# Patient Record
Sex: Female | Born: 1960
Health system: Southern US, Community
[De-identification: ages and names within clinical notes are randomized; demographics above are authoritative.]

## PROBLEM LIST (undated history)

## (undated) HISTORY — PX: TUBAL LIGATION: SHX77

## (undated) HISTORY — PX: CHOLECYSTECTOMY: SHX55

---

## 2018-05-09 ENCOUNTER — Other Ambulatory Visit: Payer: Self-pay | Admitting: Internal Medicine

## 2018-05-09 DIAGNOSIS — Z1231 Encounter for screening mammogram for malignant neoplasm of breast: Secondary | ICD-10-CM

## 2018-06-12 ENCOUNTER — Ambulatory Visit
Admission: RE | Admit: 2018-06-12 | Discharge: 2018-06-12 | Disposition: A | Discharge status: Home / Self Care | Payer: 59 | Source: Ambulatory Visit | Attending: Internal Medicine | Admitting: Internal Medicine

## 2018-06-12 DIAGNOSIS — Z1231 Encounter for screening mammogram for malignant neoplasm of breast: Secondary | ICD-10-CM

## 2018-09-18 ENCOUNTER — Ambulatory Visit (INDEPENDENT_AMBULATORY_CARE_PROVIDER_SITE_OTHER): Payer: 59

## 2018-09-18 ENCOUNTER — Ambulatory Visit: Payer: 59

## 2018-09-18 ENCOUNTER — Ambulatory Visit: Payer: 59 | Admitting: Podiatry

## 2018-09-18 ENCOUNTER — Encounter: Payer: Self-pay | Admitting: Podiatry

## 2018-09-18 VITALS — BP 101/62 | HR 63 | Resp 16

## 2018-09-18 DIAGNOSIS — M779 Enthesopathy, unspecified: Secondary | ICD-10-CM | POA: Diagnosis not present

## 2018-09-18 DIAGNOSIS — M7661 Achilles tendinitis, right leg: Secondary | ICD-10-CM | POA: Diagnosis not present

## 2018-09-18 MED ORDER — TRIAMCINOLONE ACETONIDE 10 MG/ML IJ SUSP
10.0000 mg | Freq: Once | INTRAMUSCULAR | Status: AC
  Administered 2018-09-18: 10 mg

## 2018-09-18 NOTE — Progress Notes (Signed)
   Subjective:    Patient ID: Kayla Atkinson, female    DOB: [birth year], 59 y.o.   MRN: [id number]  HPI    Review of Systems  All other systems reviewed and are negative.      Objective:   Physical Exam        Assessment & Plan:

## 2018-09-18 NOTE — Progress Notes (Signed)
Subjective:   Patient ID: Kayla Atkinson, female   DOB: 58 y.o.   MRN: [id number]   HPI Patient states have had a lot of pain in the back of the right heel and states that it is been very painful.  Patient states icing has not helped and stretching does help some.  States is been present 6 to 8 months and patient has just stop smoking likes to be active and is having trouble wearing shoe gear due to the discomfort with moderate obesity is complicating factor   Review of Systems  All other systems reviewed and are negative.       Objective:  Physical Exam Vitals signs and nursing note reviewed.  Constitutional:      Appearance: She is well-developed.  Pulmonary:     Effort: Pulmonary effort is normal.  Musculoskeletal: Normal range of motion.  Skin:    General: Skin is warm.  Neurological:     Mental Status: She is alert.     Neurovascular status intact muscle strength was adequate range of motion within normal limits with exquisite discomfort posterior aspect right heel at the insertion of the Achilles lateral side of the calcaneus with enlargement of this area with no pain in the medial or central portion of the tendon.  Has good digital perfusion well oriented x3     Assessment:  Achilles tendinitis right with inflammation fluid buildup with spur formation present     Plan:  H&P condition reviewed and recommended careful injection first explaining chances for rupture with patient.  She is willing to accept risk and today I did do a sterile prep of the lateral side and I carefully injected 3 mg Dexasone Kenalog 5 mg Xylocaine and I then applied air fracture walker to completely immobilize along with instructions for ice and reduced activity.  Reappoint to recheck in 4 weeks or earlier if needed  X-ray indicates spur formation posterior aspect heel is quite large in its nature and is probably part of the pathology she is experiencing

## 2018-09-18 NOTE — Patient Instructions (Signed)

## 2018-10-16 ENCOUNTER — Ambulatory Visit: Payer: 59 | Admitting: Podiatry

## 2018-10-21 ENCOUNTER — Ambulatory Visit: Payer: 59 | Admitting: Podiatry

## 2019-01-27 DIAGNOSIS — M5410 Radiculopathy, site unspecified: Secondary | ICD-10-CM | POA: Diagnosis not present

## 2019-01-27 DIAGNOSIS — M62838 Other muscle spasm: Secondary | ICD-10-CM | POA: Diagnosis not present

## 2019-02-03 ENCOUNTER — Ambulatory Visit
Admission: RE | Admit: 2019-02-03 | Discharge: 2019-02-03 | Disposition: A | Discharge status: Home / Self Care | Payer: BC Managed Care – PPO | Source: Ambulatory Visit | Attending: Internal Medicine | Admitting: Internal Medicine

## 2019-02-03 ENCOUNTER — Other Ambulatory Visit: Payer: Self-pay | Admitting: Internal Medicine

## 2019-02-03 ENCOUNTER — Other Ambulatory Visit: Payer: Self-pay

## 2019-02-03 DIAGNOSIS — M5410 Radiculopathy, site unspecified: Secondary | ICD-10-CM | POA: Diagnosis not present

## 2019-02-03 DIAGNOSIS — M4722 Other spondylosis with radiculopathy, cervical region: Secondary | ICD-10-CM | POA: Diagnosis not present

## 2019-02-03 DIAGNOSIS — M62838 Other muscle spasm: Secondary | ICD-10-CM | POA: Diagnosis not present

## 2019-02-03 DIAGNOSIS — M4802 Spinal stenosis, cervical region: Secondary | ICD-10-CM | POA: Diagnosis not present

## 2019-02-14 DIAGNOSIS — M25511 Pain in right shoulder: Secondary | ICD-10-CM | POA: Diagnosis not present

## 2019-02-14 DIAGNOSIS — M25512 Pain in left shoulder: Secondary | ICD-10-CM | POA: Diagnosis not present

## 2019-02-14 DIAGNOSIS — M5030 Other cervical disc degeneration, unspecified cervical region: Secondary | ICD-10-CM | POA: Diagnosis not present

## 2019-02-22 DIAGNOSIS — M542 Cervicalgia: Secondary | ICD-10-CM | POA: Diagnosis not present

## 2019-02-24 DIAGNOSIS — M5020 Other cervical disc displacement, unspecified cervical region: Secondary | ICD-10-CM | POA: Diagnosis not present

## 2019-02-27 DIAGNOSIS — M542 Cervicalgia: Secondary | ICD-10-CM | POA: Diagnosis not present

## 2019-02-27 DIAGNOSIS — M25561 Pain in right knee: Secondary | ICD-10-CM | POA: Diagnosis not present

## 2019-02-27 DIAGNOSIS — M25562 Pain in left knee: Secondary | ICD-10-CM | POA: Diagnosis not present

## 2019-02-27 DIAGNOSIS — M5412 Radiculopathy, cervical region: Secondary | ICD-10-CM | POA: Diagnosis not present

## 2019-02-27 DIAGNOSIS — M5020 Other cervical disc displacement, unspecified cervical region: Secondary | ICD-10-CM | POA: Diagnosis not present

## 2019-03-05 DIAGNOSIS — M5412 Radiculopathy, cervical region: Secondary | ICD-10-CM | POA: Diagnosis not present

## 2019-03-05 DIAGNOSIS — M542 Cervicalgia: Secondary | ICD-10-CM | POA: Diagnosis not present

## 2019-03-05 DIAGNOSIS — M50222 Other cervical disc displacement at C5-C6 level: Secondary | ICD-10-CM | POA: Diagnosis not present

## 2019-03-05 DIAGNOSIS — M50223 Other cervical disc displacement at C6-C7 level: Secondary | ICD-10-CM | POA: Diagnosis not present

## 2019-03-08 DIAGNOSIS — R17 Unspecified jaundice: Secondary | ICD-10-CM | POA: Diagnosis not present

## 2019-03-10 ENCOUNTER — Encounter (HOSPITAL_COMMUNITY): Payer: Self-pay | Admitting: *Deleted

## 2019-03-10 ENCOUNTER — Other Ambulatory Visit: Payer: Self-pay

## 2019-03-10 ENCOUNTER — Inpatient Hospital Stay (HOSPITAL_COMMUNITY)
Admission: EM | Admit: 2019-03-10 | Discharge: 2019-03-12 | DRG: 442 | Disposition: A | Discharge status: Home / Self Care | Payer: BC Managed Care – PPO | Attending: Internal Medicine | Admitting: Internal Medicine

## 2019-03-10 ENCOUNTER — Emergency Department (HOSPITAL_COMMUNITY): Payer: BC Managed Care – PPO

## 2019-03-10 DIAGNOSIS — R17 Unspecified jaundice: Secondary | ICD-10-CM | POA: Diagnosis not present

## 2019-03-10 DIAGNOSIS — Z803 Family history of malignant neoplasm of breast: Secondary | ICD-10-CM | POA: Diagnosis not present

## 2019-03-10 DIAGNOSIS — Z1159 Encounter for screening for other viral diseases: Secondary | ICD-10-CM

## 2019-03-10 DIAGNOSIS — Z23 Encounter for immunization: Secondary | ICD-10-CM | POA: Diagnosis not present

## 2019-03-10 DIAGNOSIS — M5010 Cervical disc disorder with radiculopathy, unspecified cervical region: Secondary | ICD-10-CM | POA: Diagnosis present

## 2019-03-10 DIAGNOSIS — B169 Acute hepatitis B without delta-agent and without hepatic coma: Secondary | ICD-10-CM | POA: Diagnosis not present

## 2019-03-10 DIAGNOSIS — Z03818 Encounter for observation for suspected exposure to other biological agents ruled out: Secondary | ICD-10-CM | POA: Diagnosis not present

## 2019-03-10 DIAGNOSIS — K5732 Diverticulitis of large intestine without perforation or abscess without bleeding: Secondary | ICD-10-CM | POA: Diagnosis not present

## 2019-03-10 DIAGNOSIS — E669 Obesity, unspecified: Secondary | ICD-10-CM | POA: Diagnosis not present

## 2019-03-10 DIAGNOSIS — Z6841 Body Mass Index (BMI) 40.0 and over, adult: Secondary | ICD-10-CM | POA: Diagnosis not present

## 2019-03-10 DIAGNOSIS — B179 Acute viral hepatitis, unspecified: Secondary | ICD-10-CM | POA: Diagnosis not present

## 2019-03-10 DIAGNOSIS — Z87891 Personal history of nicotine dependence: Secondary | ICD-10-CM

## 2019-03-10 LAB — URINALYSIS, ROUTINE W REFLEX MICROSCOPIC
Glucose, UA: NEGATIVE mg/dL
Hgb urine dipstick: NEGATIVE
Ketones, ur: NEGATIVE mg/dL
Nitrite: NEGATIVE
Protein, ur: 30 mg/dL — AB
Specific Gravity, Urine: 1.024 (ref 1.005–1.030)
pH: 5 (ref 5.0–8.0)

## 2019-03-10 LAB — COMPREHENSIVE METABOLIC PANEL
ALT: 869 U/L — ABNORMAL HIGH (ref 0–44)
AST: 900 U/L — ABNORMAL HIGH (ref 15–41)
Albumin: 2.5 g/dL — ABNORMAL LOW (ref 3.5–5.0)
Alkaline Phosphatase: 206 U/L — ABNORMAL HIGH (ref 38–126)
Anion gap: 11 (ref 5–15)
BUN: 11 mg/dL (ref 6–20)
CO2: 20 mmol/L — ABNORMAL LOW (ref 22–32)
Calcium: 8.9 mg/dL (ref 8.9–10.3)
Chloride: 102 mmol/L (ref 98–111)
Creatinine, Ser: 0.9 mg/dL (ref 0.44–1.00)
GFR calc Af Amer: >60 mL/min (ref >60)
GFR calc non Af Amer: >60 mL/min (ref >60)
Glucose, Bld: 108 mg/dL — ABNORMAL HIGH (ref 70–99)
Potassium: 3.5 mmol/L (ref 3.5–5.1)
Sodium: 133 mmol/L — ABNORMAL LOW (ref 135–145)
Total Bilirubin: 24.6 mg/dL (ref 0.3–1.2)
Total Protein: 7.1 g/dL (ref 6.5–8.1)

## 2019-03-10 LAB — CBC
HCT: 39.5 % (ref 36.0–46.0)
Hemoglobin: 13.4 g/dL (ref 12.0–15.0)
MCH: 28.9 pg (ref 26.0–34.0)
MCHC: 33.9 g/dL (ref 30.0–36.0)
MCV: 85.3 fL (ref 80.0–100.0)
Platelets: 258 10*3/uL (ref 150–400)
RBC: 4.63 MIL/uL (ref 3.87–5.11)
RDW: 20.8 % — ABNORMAL HIGH (ref 11.5–15.5)
WBC: 6.5 10*3/uL (ref 4.0–10.5)
nRBC: 0 % (ref 0.0–0.2)

## 2019-03-10 LAB — I-STAT BETA HCG BLOOD, ED (MC, WL, AP ONLY): I-stat hCG, quantitative: 16.3 m[IU]/mL — ABNORMAL HIGH (ref <5)

## 2019-03-10 LAB — LIPASE, BLOOD: Lipase: 44 U/L (ref 11–51)

## 2019-03-10 LAB — PREGNANCY, URINE: Preg Test, Ur: NEGATIVE

## 2019-03-10 MED ORDER — SODIUM CHLORIDE 0.9 % IV BOLUS
1000.0000 mL | Freq: Once | INTRAVENOUS | Status: AC
  Administered 2019-03-10: 1000 mL via INTRAVENOUS

## 2019-03-10 MED ORDER — IOHEXOL 300 MG/ML  SOLN
100.0000 mL | Freq: Once | INTRAMUSCULAR | Status: AC | PRN
  Administered 2019-03-10: 100 mL via INTRAVENOUS

## 2019-03-10 MED ORDER — SODIUM CHLORIDE 0.9% FLUSH: 3.0000 mL | Freq: Once | INTRAVENOUS | Status: DC

## 2019-03-10 NOTE — ED Triage Notes (Signed)
To ED for eval after being called by pcp due to increased bilirubin. Pt went to walk-in clinic on Saturday due to abd pain and jaundice. No hx of same. Intermittent abd pain now and intermittent vomiting. No diarrhea.

## 2019-03-10 NOTE — ED Provider Notes (Signed)
[place] EMERGENCY DEPARTMENT Provider Note   CSN: [id number] Arrival date & time: 03/10/19  1540    History   Chief Complaint Chief Complaint  Patient presents with  . Abdominal Pain  . abnormal labs    HPI Kayla Atkinson is a 58 y.o. female.    Patient presenting at the request of her PCP from [place] for concern about acute hepatitis. Has been increasingly fatigued and nauseous over the past 1-2 weeks. Initially attributed this to moving residences in May. Began to notice pale stool 1 week ago that has been intermittent, persistent, waxing and waning. Urine becoming progressively darker in color x 2 weeks.  Has been on various medications x 1 month for presumed BUE radiculopathy 2/2 DDD in her C-spine. First week June: Naproxen, Robaxin (?) Second week June: Prednisone taper Third week June: Started on Gabapentin x 1 week. Stopped after 1 week due to upper abdominal pressure and nausea.  Last week June: Subsequently placed on 10/325 Percocet, but only took 0.5 tablet x 1. Denies taking any additional Tylenol over the past 2 months.  Seen at [facility] clinic 2 days ago; performed labs which resulted today. Denies recent travel, drinking from any questionable water source, sick contacts, IVDU, ETOH use. No recent abx use. No hx of hepatitis. Reports having Hepatitis C test earlier this year that was negative. Denies fever, cough, SOB, vomiting, melena, hematochezia, dysuria, increased bleeding/bruising. Hx of cholecystectomy, tubal ligation.   Abdominal Pain   History reviewed. No pertinent past medical history.  There are no active problems to display for this patient.   History reviewed. No pertinent surgical history.   OB History   No obstetric history on file.      Home Medications    Prior to Admission medications   Not on File    Family History Family History  Problem Relation Age of Onset  . Breast cancer Sister 16    Social  History Social History   Tobacco Use  . Smoking status: Current Every Day Smoker    Types: Cigarettes, E-cigarettes    Last attempt to quit: 04/28/2018    Years since quitting: 0.8  . Smokeless tobacco: Never Used  . Tobacco comment: Quit smoking cigarettes 04/2018; is vaping  Substance Use Topics  . Alcohol use: Not on file  . Drug use: Not on file     Allergies   Patient has no known allergies.   Review of Systems Review of Systems  Gastrointestinal: Positive for abdominal pain.  Ten systems reviewed and are negative for acute change, except as noted in the HPI.     Physical Exam Updated Vital Signs BP 131/67   Pulse 72   Temp 98 F (36.7 C) (Oral)   Resp 17   SpO2 100%   Physical Exam Vitals signs and nursing note reviewed.  Constitutional:      General: She is not in acute distress.    Appearance: She is well-developed. She is not diaphoretic.     Comments: Nontoxic appearing and in NAD. Obese female.  HENT:     Head: Normocephalic and atraumatic.  Eyes:     General: No scleral icterus.    Conjunctiva/sclera: Conjunctivae normal.  Neck:     Musculoskeletal: Normal range of motion.  Cardiovascular:     Rate and Rhythm: Normal rate and regular rhythm.     Pulses: Normal pulses.  Pulmonary:     Effort: Pulmonary effort is normal. No respiratory distress.  Comments: Respirations even and unlabored Abdominal:     Tenderness: There is abdominal tenderness.     Comments: No distinctive organomegaly, but exam limited secondary to habitus.  There is mild tenderness in the right upper quadrant.  No guarding. No peritoneal signs.  Musculoskeletal: Normal range of motion.  Skin:    General: Skin is warm and dry.     Coloration: Skin is not pale.     Findings: No erythema or rash.  Neurological:     General: No focal deficit present.     Mental Status: She is alert and oriented to person, place, and time.     Coordination: Coordination normal.     Comments:  GCS 15. Ambulatory with steady gait.  Psychiatric:        Behavior: Behavior normal.      ED Treatments / Results  Labs (all labs ordered are listed, but only abnormal results are displayed) Labs Reviewed  COMPREHENSIVE METABOLIC PANEL - Abnormal; Notable for the following components:      Result Value   Sodium 133 (*)    CO2 20 (*)    Glucose, Bld 108 (*)    Albumin 2.5 (*)    AST 900 (*)    ALT 869 (*)    Alkaline Phosphatase 206 (*)    Total Bilirubin 24.6 (*)    All other components within normal limits  CBC - Abnormal; Notable for the following components:   RDW 20.8 (*)    All other components within normal limits  URINALYSIS, ROUTINE W REFLEX MICROSCOPIC - Abnormal; Notable for the following components:   Color, Urine AMBER (*)    APPearance HAZY (*)    Bilirubin Urine MODERATE (*)    Protein, ur 30 (*)    Leukocytes,Ua TRACE (*)    Bacteria, UA RARE (*)    All other components within normal limits  I-STAT BETA HCG BLOOD, ED (MC, WL, AP ONLY) - Abnormal; Notable for the following components:   I-stat hCG, quantitative 16.3 (*)    All other components within normal limits  NOVEL CORONAVIRUS, NAA (HOSPITAL ORDER, SEND-OUT TO REF LAB)  LIPASE, BLOOD  PREGNANCY, URINE  HEPATITIS PANEL, ACUTE  PROTIME-INR  ACETAMINOPHEN LEVEL  SALICYLATE LEVEL  APTT    EKG None  Radiology Ct Abdomen Pelvis W Contrast  Result Date: 03/10/2019 CLINICAL DATA:  Abnormal liver function test EXAM: CT ABDOMEN AND PELVIS WITH CONTRAST TECHNIQUE: Multidetector CT imaging of the abdomen and pelvis was performed using the standard protocol following bolus administration of intravenous contrast. CONTRAST:  136m OMNIPAQUE IOHEXOL 300 MG/ML  SOLN COMPARISON:  None. FINDINGS: Lower chest: No acute abnormality. Hepatobiliary: No focal hepatic abnormality. Status post cholecystectomy. Mild periportal edema. Hazy edema at the porta hepatis. No biliary dilatation. No definitive calcified stones in  the region of the common duct. Pancreas: Unremarkable. No pancreatic ductal dilatation or surrounding inflammatory changes. Spleen: Normal in size without focal abnormality. Adrenals/Urinary Tract: Adrenal glands are normal. No hydronephrosis. Subcentimeter hypodensities within the kidneys too small to further characterize. The bladder is nearly empty Stomach/Bowel: Stomach is within normal limits. Appendix appears normal. No evidence of bowel wall thickening, distention, or inflammatory changes. Sigmoid colon diverticula without acute inflammatory process Vascular/Lymphatic: Mild aortic atherosclerosis. No aneurysm. Peripancreatic and porta hepatis lymph nodes measuring up to 19 mm Reproductive: Uterus and bilateral adnexa are unremarkable. Other: Small free fluid in the pelvis.  No free air Musculoskeletal: No acute or significant osseous findings. IMPRESSION: 1. Status post cholecystectomy.  Negative for biliary dilatation. 2. There is mild periportal edema and generalized edema/inflammation at the porta hepatis. Differential considerations include hepatitis and biliary tract infection although there is no significant biliary dilatation. 3. Sigmoid colon diverticular disease without acute inflammation Electronically Signed   By: Donavan Foil M.D.   On: 03/10/2019 23:53    Procedures Procedures (including critical care time)  Medications Ordered in ED Medications  sodium chloride flush (NS) 0.9 % injection 3 mL (3 mLs Intravenous Not Given 03/10/19 2343)  sodium chloride 0.9 % bolus 1,000 mL (1,000 mLs Intravenous New Bag/Given 03/10/19 2348)  iohexol (OMNIPAQUE) 300 MG/ML solution 100 mL (100 mLs Intravenous Contrast Given 03/10/19 2330)    10:35 PM CMP from 03/08/19 AST - 873 ALT - 855 Tbili - 19.6 Alk Phos - 240 Albumin - 3.2  12:17 AM CT negative for mass/tumor. There is periportal edema and generalized inflammation; favor hepatitis rather than biliary tract infection given lack of fever,  leukocytosis. Patient without present concern for SIRS/Sepsis. Will admit.    Initial Impression / Assessment and Plan / ED Course  I have reviewed the triage vital signs and the nursing notes.  Pertinent labs & imaging results that were available during my care of the patient were reviewed by me and considered in my medical decision making (see chart for details).        58 year old female to be admitted for further evaluation of suspected acute hepatitis.  Hemodynamically stable and afebrile.  Dr. Hal Hope to admit.   Final Clinical Impressions(s) / ED Diagnoses   Final diagnoses:  Acute hepatitis    ED Discharge Orders    None       Antonietta Breach, PA-C 03/11/19 Hoyle Sauer, MD 03/11/19 1207

## 2019-03-11 ENCOUNTER — Encounter (HOSPITAL_COMMUNITY): Payer: Self-pay | Admitting: Internal Medicine

## 2019-03-11 ENCOUNTER — Observation Stay (HOSPITAL_COMMUNITY): Payer: BC Managed Care – PPO

## 2019-03-11 DIAGNOSIS — B179 Acute viral hepatitis, unspecified: Secondary | ICD-10-CM | POA: Diagnosis present

## 2019-03-11 DIAGNOSIS — E669 Obesity, unspecified: Secondary | ICD-10-CM | POA: Diagnosis present

## 2019-03-11 DIAGNOSIS — B169 Acute hepatitis B without delta-agent and without hepatic coma: Secondary | ICD-10-CM | POA: Diagnosis present

## 2019-03-11 DIAGNOSIS — Z1159 Encounter for screening for other viral diseases: Secondary | ICD-10-CM | POA: Diagnosis not present

## 2019-03-11 DIAGNOSIS — R17 Unspecified jaundice: Secondary | ICD-10-CM | POA: Diagnosis not present

## 2019-03-11 DIAGNOSIS — Z23 Encounter for immunization: Secondary | ICD-10-CM | POA: Diagnosis not present

## 2019-03-11 DIAGNOSIS — Z803 Family history of malignant neoplasm of breast: Secondary | ICD-10-CM | POA: Diagnosis not present

## 2019-03-11 DIAGNOSIS — Z6841 Body Mass Index (BMI) 40.0 and over, adult: Secondary | ICD-10-CM | POA: Diagnosis not present

## 2019-03-11 DIAGNOSIS — M5010 Cervical disc disorder with radiculopathy, unspecified cervical region: Secondary | ICD-10-CM | POA: Diagnosis present

## 2019-03-11 DIAGNOSIS — Z87891 Personal history of nicotine dependence: Secondary | ICD-10-CM | POA: Diagnosis not present

## 2019-03-11 LAB — PROTIME-INR
INR: 1.4 — ABNORMAL HIGH (ref 0.8–1.2)
Prothrombin Time: 16.7 seconds — ABNORMAL HIGH (ref 11.4–15.2)

## 2019-03-11 LAB — SALICYLATE LEVEL
Salicylate Lvl: 11.3 mg/dL (ref 2.8–30.0)
Salicylate Lvl: 10.5 mg/dL (ref 2.8–30.0)

## 2019-03-11 LAB — CBC WITH DIFFERENTIAL/PLATELET
Abs Immature Granulocytes: 0.01 10*3/uL (ref 0.00–0.07)
Basophils Absolute: 0.1 10*3/uL (ref 0.0–0.1)
Basophils Relative: 1 %
Eosinophils Absolute: 0.2 10*3/uL (ref 0.0–0.5)
Eosinophils Relative: 2 %
HCT: 37.5 % (ref 36.0–46.0)
Hemoglobin: 12.7 g/dL (ref 12.0–15.0)
Immature Granulocytes: 0 %
Lymphocytes Relative: 34 %
Lymphs Abs: 2.2 10*3/uL (ref 0.7–4.0)
MCH: 28.7 pg (ref 26.0–34.0)
MCHC: 33.9 g/dL (ref 30.0–36.0)
MCV: 84.7 fL (ref 80.0–100.0)
Monocytes Absolute: 0.8 10*3/uL (ref 0.1–1.0)
Monocytes Relative: 12 %
Neutro Abs: 3.2 10*3/uL (ref 1.7–7.7)
Neutrophils Relative %: 51 %
Platelets: 239 10*3/uL (ref 150–400)
RBC: 4.43 MIL/uL (ref 3.87–5.11)
RDW: 20.5 % — ABNORMAL HIGH (ref 11.5–15.5)
WBC: 6.4 10*3/uL (ref 4.0–10.5)
nRBC: 0 % (ref 0.0–0.2)

## 2019-03-11 LAB — BASIC METABOLIC PANEL
Anion gap: 10 (ref 5–15)
BUN: 9 mg/dL (ref 6–20)
CO2: 22 mmol/L (ref 22–32)
Calcium: 8.8 mg/dL — ABNORMAL LOW (ref 8.9–10.3)
Chloride: 103 mmol/L (ref 98–111)
Creatinine, Ser: 0.85 mg/dL (ref 0.44–1.00)
GFR calc Af Amer: >60 mL/min (ref >60)
GFR calc non Af Amer: >60 mL/min (ref >60)
Glucose, Bld: 99 mg/dL (ref 70–99)
Potassium: 3.3 mmol/L — ABNORMAL LOW (ref 3.5–5.1)
Sodium: 135 mmol/L (ref 135–145)

## 2019-03-11 LAB — HEPATIC FUNCTION PANEL
ALT: 897 U/L — ABNORMAL HIGH (ref 0–44)
AST: 924 U/L — ABNORMAL HIGH (ref 15–41)
Albumin: 2.4 g/dL — ABNORMAL LOW (ref 3.5–5.0)
Alkaline Phosphatase: 198 U/L — ABNORMAL HIGH (ref 38–126)
Bilirubin, Direct: 15.8 mg/dL — ABNORMAL HIGH (ref 0.0–0.2)
Total Bilirubin: 24.7 mg/dL (ref 0.3–1.2)
Total Protein: 6.8 g/dL (ref 6.5–8.1)

## 2019-03-11 LAB — APTT: aPTT: 41 seconds — ABNORMAL HIGH (ref 24–36)

## 2019-03-11 LAB — ACETAMINOPHEN LEVEL
Acetaminophen (Tylenol), Serum: <10 ug/mL — ABNORMAL LOW (ref 10–30)
Acetaminophen (Tylenol), Serum: <10 ug/mL — ABNORMAL LOW (ref 10–30)

## 2019-03-11 LAB — RAPID URINE DRUG SCREEN, HOSP PERFORMED
Amphetamines: NOT DETECTED
Barbiturates: NOT DETECTED
Benzodiazepines: NOT DETECTED
Cocaine: NOT DETECTED
Opiates: NOT DETECTED
Tetrahydrocannabinol: NOT DETECTED

## 2019-03-11 LAB — HIV ANTIBODY (ROUTINE TESTING W REFLEX): HIV Screen 4th Generation wRfx: NONREACTIVE

## 2019-03-11 LAB — TSH: TSH: 3.248 u[IU]/mL (ref 0.350–4.500)

## 2019-03-11 LAB — GAMMA GT: GGT: 142 U/L — ABNORMAL HIGH (ref 7–50)

## 2019-03-11 MED ORDER — ONDANSETRON HCL 4 MG PO TABS: 4.0000 mg | ORAL_TABLET | Freq: Four times a day (QID) | ORAL | Status: DC | PRN

## 2019-03-11 MED ORDER — PNEUMOCOCCAL VAC POLYVALENT 25 MCG/0.5ML IJ INJ
0.5000 mL | INJECTION | INTRAMUSCULAR | Status: AC
  Administered 2019-03-12: 0.5 mL via INTRAMUSCULAR
  Filled 2019-03-11: qty 0.5

## 2019-03-11 MED ORDER — POTASSIUM CHLORIDE 10 MEQ/100ML IV SOLN
10.0000 meq | INTRAVENOUS | Status: AC
  Administered 2019-03-11 (×2): 10 meq via INTRAVENOUS
  Filled 2019-03-11 (×2): qty 100

## 2019-03-11 MED ORDER — ONDANSETRON HCL 4 MG/2ML IJ SOLN: 4.0000 mg | Freq: Four times a day (QID) | INTRAMUSCULAR | Status: DC | PRN

## 2019-03-11 NOTE — Plan of Care (Signed)

## 2019-03-11 NOTE — Progress Notes (Signed)
PROGRESS NOTE  Kayla RegesDebra Atkinson ZOX:[id number] DOB: 04-Jul-1961 DOA: 03/10/2019 PCP: Lorenda IshiharaVaradarajan, Rupashree, MD  Brief History   Kayla RegesDebra Stork is a 58 y.o. female with no significant past medical history was referred to the ER after patient's LFTs were found to be markedly elevated.  Patient states around 4 weeks ago patient had gone to her PCP and was prescribed naproxen and muscle relaxant for neck neck pain patient also was given a course of prednisone.  Patient eventually was referred to orthopedics because x-ray was showing degenerative changes and patient was given a course of prednisone which patient almost completed 2 weeks ago.  Patient also was prescribed Percocet 5/325 which patient states she only took half a pill once 2 weeks ago.  Has not taken any new pills over the last 2 weeks.  Over the last 3 weeks patient has been noticing increasing abdominal pain on eating and last 1 week has been noticing jaundice.  Had gone to her primary care physician 2 days ago and had labs drawn which showed elevated LFTs and was referred to the ER.  Denies any fever chills recent travel recent sick contacts denies any nausea vomiting diarrhea.  ED Course: In the ER patient was afebrile.  Labs show anion gap of 11 bicarb 20 AST 900 ALT 869 total bilirubin 24.6 hemoglobin 13.4 platelets 258 and salicylate levels were 1.3.patient states he does not take any aspirin Goody powder or BC powder or any type of salicylates.  INR was 1.4.  COVID-19 test is pending.  CT scan of the abdomen done shows periportal edema and inflammation around the porta hepatis differentials include infection or hepatitis.  No signs of any biliary dilatation.  Patient is afebrile.  Patient admitted for further management.  The patient has been admitted to a medical bed. [facility] has been consulted. Right upper quadrant ultrasound has been ordered,  Consultants  . Gastroenterology  Procedures  . None  Antibiotics   Anti-infectives (From  admission, onward)   None    .   Subjective  The patient is resting comfortably. She states that she is a little nauseated and that she has some abdominal discomfort after she eats.   Objective   Vitals:  Vitals:   03/11/19 0158 03/11/19 1421  BP: (!) 144/76 121/74  Pulse: 84 (!) 58  Resp: 14 18  Temp: 97.8 F (36.6 C) 98.1 F (36.7 C)  SpO2: 99% 100%    Exam:  Constitutional:  . The patient is awake, alert, and oriented x 3. No acute distress. Eyes:  Marland Kitchen. Sclera are icteric Respiratory:  . No increased work of breathing. . No wheezes, rhonchi, or rales. . No tactile fremitus. Cardiovascular:  . Regular rate and rhythm . No murmurs, ectopy, or gallups. . No lateral PMI. No thrills. Abdomen:  . Abdomen is soft, non-tender, non-distended. . No hernias, masses, or organomegaly are appreciated. . Normoactive bowel sounds. Musculoskeletal:  . No cyanosis, clubbing, or edema Skin:  . No rashes, lesions, ulcers . palpation of skin: no induration or nodules . Jaundiced Neurologic:  . CN 2-12 intact . Sensation all 4 extremities intact Psychiatric:  . Mental status o Mood, affect appropriate o Orientation to person, place, time  . judgment and insight appear intact  I have personally reviewed the following:   Today's Data  . CMP, CBC, Vitals  Scheduled Meds: . [START ON 03/12/2019] pneumococcal 23 valent vaccine  0.5 mL Intramuscular Tomorrow-1000  . sodium chloride flush  3 mL Intravenous Once  Continuous Infusions:  Principal Problem:   Acute hepatitis Active Problems:   Jaundice   LOS: 0 days   A & P  Acute hepatitis, jaundice: Hepatitis panel is pending. Total bilirubin is 24. Direct bilirubin is 15. Right upper quadrant ultrasound is pending. Tylenol level is negative x 2. Salicylate levels are positive, but not elevated. ANA and SM antibodies are pending as are antimitochondrial antibodies and ceruloplasmin. INR is 1.4. GI has been consulted.  I  have seen and examined this patient myself. I have spent 38 minutes in her evaluation and care.  DVT prophylaxis: SCD's Code Status: Full Code Family Communication: None available Disposition Plan: home  Ashaunti Treptow, DO [facility] Direct contact: see [email or web address]  7PM-7AM contact night coverage as above 03/11/2019, 4:16 PM  LOS: 0 days

## 2019-03-11 NOTE — ED Notes (Signed)
ED TO INPATIENT HANDOFF REPORT  ED Nurse Name and Phone #:  Lucious GrovesRobert RN [phone]  S Name/Age/Gender Kayla Atkinson 58 y.o. female Room/Bed: 020C/020C  Code Status: FULL  Home/SNF/Other : HOME {Patient oriented x4 Is this baseline? yes  Triage Complete: Triage complete  Chief Complaint high bilirubin  Triage Note To ED for eval after being called by pcp due to increased bilirubin. Pt went to walk-in clinic on Saturday due to abd pain and jaundice. No hx of same. Intermittent abd pain now and intermittent vomiting. No diarrhea.    Allergies No Known Allergies  Level of Care/Admitting Diagnosis ED Disposition    ED Disposition Condition Comment   Admit  [facility] [[id number]]  Level of Care: Med-Surg [16]  I expect the patient will be discharged within 24 hours: No (not a candidate for 5C-Observation unit)  Covid Evaluation: Asymptomatic Screening Protocol (No Symptoms)  Diagnosis: Acute hepatitis [454098][[id number]]  Admitting Physician: Eduard ClosKAKRAKANDY, ARSHAD N [phone][[id number]]  Attending Physician: Eduard ClosKAKRAKANDY, ARSHAD N Florian.Pax[[id number]]  PT Class (Do Not Modify): Observation [104]  PT Acc Code (Do Not Modify): Observation [10022]       B Medical/Surgery History History reviewed. No pertinent past medical history. History reviewed. No pertinent surgical history.   A IV Location/Drains/Wounds Patient Lines/Drains/Airways Status   Active Line/Drains/Airways    Name:   Placement date:   Placement time:   Site:   Days:   Peripheral IV 03/10/19 Right;Anterior Forearm   03/10/19    2314    Forearm   1          Intake/Output Last 24 hours No intake or output data in the 24 hours ending 03/11/19 0032  Labs/Imaging Results for orders placed or performed during the hospital encounter of 03/10/19 (from the past 48 hour(s))  Lipase, blood     Status: None   Collection Time: 03/10/19  4:14 PM  Result Value Ref Range   Lipase 44 11 - 51 U/L    Comment: Performed at [facility], [address] [place], [place] [address]  Comprehensive metabolic panel     Status: Abnormal   Collection Time: 03/10/19  4:14 PM  Result Value Ref Range   Sodium 133 (L) 135 - 145 mmol/L   Potassium 3.5 3.5 - 5.1 mmol/L   Chloride 102 98 - 111 mmol/L   CO2 20 (L) 22 - 32 mmol/L   Glucose, Bld 108 (H) 70 - 99 mg/dL   BUN 11 6 - 20 mg/dL   Creatinine, Ser 5.620.90 0.44 - 1.00 mg/dL   Calcium 8.9 8.9 - 13.010.3 mg/dL   Total Protein 7.1 6.5 - 8.1 g/dL   Albumin 2.5 (L) 3.5 - 5.0 g/dL   AST 865900 (H) 15 - 41 U/L   ALT 869 (H) 0 - 44 U/L   Alkaline Phosphatase 206 (H) 38 - 126 U/L   Total Bilirubin 24.6 (HH) 0.3 - 1.2 mg/dL    Comment: CRITICAL RESULT CALLED TO, READ BACK BY AND VERIFIED WITH: C.WOODRUFF RN 1714 03/10/2019 MCCORMICK K    GFR calc non Af Amer >60 >60 mL/min   GFR calc Af Amer >60 >60 mL/min   Anion gap 11 5 - 15    Comment: Performed at [facility], [address] [place], [place] [address]  CBC     Status: Abnormal   Collection Time: 03/10/19  4:14 PM  Result Value Ref Range   WBC 6.5 4.0 - 10.5  K/uL   RBC 4.63 3.87 - 5.11 MIL/uL   Hemoglobin 13.4 12.0 - 15.0 g/dL   HCT 16.139.5 09.636.0 - 04.546.0 %   MCV 85.3 80.0 - 100.0 fL   MCH 28.9 26.0 - 34.0 pg   MCHC 33.9 30.0 - 36.0 g/dL   RDW 40.920.8 (H) 81.111.5 - 91.415.5 %   Platelets 258 150 - 400 K/uL   nRBC 0.0 0.0 - 0.2 %    Comment: Performed at [facility], [address] [place], [place] [address]  I-Stat beta hCG blood, ED     Status: Abnormal   Collection Time: 03/10/19  4:18 PM  Result Value Ref Range   I-stat hCG, quantitative 16.3 (H) <5 mIU/mL   Comment 3            Comment:   GEST. AGE      CONC.  (mIU/mL)   <=1 WEEK        5 - 50     2 WEEKS       50 - 500     3 WEEKS       100 - 10,000     4 WEEKS     1,000 - 30,000        FEMALE AND NON-PREGNANT FEMALE:     LESS THAN 5 mIU/mL   Urinalysis, Routine w reflex microscopic     Status: Abnormal   Collection Time: 03/10/19 11:00 PM  Result Value  Ref Range   Color, Urine AMBER (A) YELLOW    Comment: BIOCHEMICALS MAY BE AFFECTED BY COLOR   APPearance HAZY (A) CLEAR   Specific Gravity, Urine 1.024 1.005 - 1.030   pH 5.0 5.0 - 8.0   Glucose, UA NEGATIVE NEGATIVE mg/dL   Hgb urine dipstick NEGATIVE NEGATIVE   Bilirubin Urine MODERATE (A) NEGATIVE   Ketones, ur NEGATIVE NEGATIVE mg/dL   Protein, ur 30 (A) NEGATIVE mg/dL   Nitrite NEGATIVE NEGATIVE   Leukocytes,Ua TRACE (A) NEGATIVE   RBC / HPF 0-5 0 - 5 RBC/hpf   WBC, UA 0-5 0 - 5 WBC/hpf   Bacteria, UA RARE (A) NONE SEEN   Squamous Epithelial / LPF 0-5 0 - 5   Mucus PRESENT    Hyaline Casts, UA PRESENT     Comment: Performed at [facility], [address] [place], [place] [address]  Pregnancy, urine     Status: None   Collection Time: 03/10/19 11:00 PM  Result Value Ref Range   Preg Test, Ur NEGATIVE NEGATIVE    Comment: Performed at [facility], [address] [place], [place] [address]   Ct Abdomen Pelvis W Contrast  Result Date: 03/10/2019 CLINICAL DATA:  Abnormal liver function test EXAM: CT ABDOMEN AND PELVIS WITH CONTRAST TECHNIQUE: Multidetector CT imaging of the abdomen and pelvis was performed using the standard protocol following bolus administration of intravenous contrast. CONTRAST:  100mL OMNIPAQUE IOHEXOL 300 MG/ML  SOLN COMPARISON:  None. FINDINGS: Lower chest: No acute abnormality. Hepatobiliary: No focal hepatic abnormality. Status post cholecystectomy. Mild periportal edema. Hazy edema at the porta hepatis. No biliary dilatation. No definitive calcified stones in the region of the common duct. Pancreas: Unremarkable. No pancreatic ductal dilatation or surrounding inflammatory changes. Spleen: Normal in size without focal abnormality. Adrenals/Urinary Tract: Adrenal glands are normal. No hydronephrosis. Subcentimeter hypodensities within the kidneys too small to further characterize. The bladder is nearly empty Stomach/Bowel: Stomach is within normal  limits. Appendix appears normal. No evidence of  bowel wall thickening, distention, or inflammatory changes. Sigmoid colon diverticula without acute inflammatory process Vascular/Lymphatic: Mild aortic atherosclerosis. No aneurysm. Peripancreatic and porta hepatis lymph nodes measuring up to 19 mm Reproductive: Uterus and bilateral adnexa are unremarkable. Other: Small free fluid in the pelvis.  No free air Musculoskeletal: No acute or significant osseous findings. IMPRESSION: 1. Status post cholecystectomy.  Negative for biliary dilatation. 2. There is mild periportal edema and generalized edema/inflammation at the porta hepatis. Differential considerations include hepatitis and biliary tract infection although there is no significant biliary dilatation. 3. Sigmoid colon diverticular disease without acute inflammation Electronically Signed   By: Donavan Foil M.D.   On: 03/10/2019 23:53    Pending Labs Unresulted Labs (From admission, onward)    Start     Ordered   03/10/19 2254  Novel Coronavirus,NAA,(SEND-OUT TO REF LAB - TAT 24-48 hrs); Hosp Order  (Asymptomatic Patients Labs)  Once,   STAT    Question:  Rule Out  Answer:  Yes   03/10/19 2253   03/10/19 2244  APTT  ONCE - STAT,   STAT     03/10/19 2243   03/10/19 2224  Hepatitis panel, acute  ONCE - STAT,   STAT     03/10/19 2223   03/10/19 2224  Protime-INR  ONCE - STAT,   STAT     03/10/19 2223   03/10/19 2224  Acetaminophen level  ONCE - STAT,   STAT     03/10/19 2223   43/32/95 1884  Salicylate level  ONCE - STAT,   STAT     03/10/19 2223          Vitals/Pain Today's Vitals   03/10/19 2052 03/10/19 2303 03/10/19 2304 03/10/19 2315  BP: 140/77 (!) 146/79  131/67  Pulse: 80  74 72  Resp: 18  19 17   Temp: 98 F (36.7 C)     TempSrc: Oral     SpO2: 100%  96% 100%  PainSc:        Isolation Precautions No active isolations  Medications Medications  sodium chloride flush (NS) 0.9 % injection 3 mL (3 mLs Intravenous Not Given  03/10/19 2343)  sodium chloride 0.9 % bolus 1,000 mL (1,000 mLs Intravenous New Bag/Given 03/10/19 2348)  iohexol (OMNIPAQUE) 300 MG/ML solution 100 mL (100 mLs Intravenous Contrast Given 03/10/19 2330)    Mobility walks Low fall risk   Focused Assessments Denies pain/respiration unlabored.    R Recommendations: See Admitting Provider Note  Report given to:   Additional Notes:

## 2019-03-11 NOTE — ED Notes (Signed)
Attempted to call report

## 2019-03-11 NOTE — H&P (Signed)
History and Physical    Kayla Atkinson ZOX:[id number] DOB: 1961/02/27 DOA: 03/10/2019  PCP: Lorenda IshiharaVaradarajan, Rupashree, MD  Patient coming from: Home.  Chief Complaint: Jaundice.  Abnormal LFTs.  HPI: Kayla RegesDebra Arvidson is a 58 y.o. female with no significant past medical history was referred to the ER after patient's LFTs were found to be markedly elevated.  Patient states around 4 weeks ago patient had gone to her PCP and was prescribed naproxen and muscle relaxant for neck neck pain patient also was given a course of prednisone.  Patient eventually was referred to orthopedics because x-ray was showing degenerative changes and patient was given a course of prednisone which patient almost completed 2 weeks ago.  Patient also was prescribed Percocet 5/325 which patient states she only took half a pill once 2 weeks ago.  Has not taken any new pills over the last 2 weeks.  Over the last 3 weeks patient has been noticing increasing abdominal pain on eating and last 1 week has been noticing jaundice.  Had gone to her primary care physician 2 days ago and had labs drawn which showed elevated LFTs and was referred to the ER.  Denies any fever chills recent travel recent sick contacts denies any nausea vomiting diarrhea.  ED Course: In the ER patient was afebrile.  Labs show anion gap of 11 bicarb 20 AST 900 ALT 869 total bilirubin 24.6 hemoglobin 13.4 platelets 258 and salicylate levels were 1.3.patient states he does not take any aspirin Goody powder or BC powder or any type of salicylates.  INR was 1.4.  COVID-19 test is pending.  CT scan of the abdomen done shows periportal edema and inflammation around the porta hepatis differentials include infection or hepatitis.  No signs of any biliary dilatation.  Patient is afebrile.  Patient admitted for further management.  Review of Systems: As per HPI, rest all negative.   History reviewed. No pertinent past medical history.  Past Surgical History:  Procedure  Laterality Date  . CHOLECYSTECTOMY    . TUBAL LIGATION       reports that she quit smoking about 10 months ago. Her smoking use included cigarettes and e-cigarettes. She has never used smokeless tobacco. She reports current alcohol use. No history on file for drug.  No Known Allergies  Family History  Problem Relation Age of Onset  . Breast cancer Sister 2856    Prior to Admission medications   Not on File    Physical Exam: Constitutional: Moderately built and nourished. Vitals:   03/10/19 2315 03/11/19 0000 03/11/19 0030 03/11/19 0100  BP: 131/67 134/65 (!) 141/63 124/87  Pulse: 72 73 82 73  Resp: 17 13 (!) 21 17  Temp:      TempSrc:      SpO2: 100% 100% 99% 99%   Eyes: Icterus present.  No pallor. ENMT: No discharge from the ears eyes nose or mouth. Neck: No mass felt.  No neck rigidity. Respiratory: No rhonchi or crepitations. Cardiovascular: S1-S2 heard. Abdomen: Soft nontender bowel sounds present. Musculoskeletal: No edema.  No joint effusion. Skin: No rash. Neurologic: Alert awake oriented to time place and person.  Moves all extremities. Psychiatric: Appears normal per normal affect.   Labs on Admission: I have personally reviewed following labs and imaging studies  CBC: Recent Labs  Lab 03/10/19 1614  WBC 6.5  HGB 13.4  HCT 39.5  MCV 85.3  PLT 258   Basic Metabolic Panel: Recent Labs  Lab 03/10/19 1614  NA 133*  K 3.5  CL 102  CO2 20*  GLUCOSE 108*  BUN 11  CREATININE 0.90  CALCIUM 8.9   GFR: CrCl cannot be calculated (Unknown ideal weight.). Liver Function Tests: Recent Labs  Lab 03/10/19 1614  AST 900*  ALT 869*  ALKPHOS 206*  BILITOT 24.6*  PROT 7.1  ALBUMIN 2.5*   Recent Labs  Lab 03/10/19 1614  LIPASE 44   No results for input(s): AMMONIA in the last 168 hours. Coagulation Profile: Recent Labs  Lab 03/11/19 0007  INR 1.4*   Cardiac Enzymes: No results for input(s): CKTOTAL, CKMB, CKMBINDEX, TROPONINI in the last  168 hours. BNP (last 3 results) No results for input(s): PROBNP in the last 8760 hours. HbA1C: No results for input(s): HGBA1C in the last 72 hours. CBG: No results for input(s): GLUCAP in the last 168 hours. Lipid Profile: No results for input(s): CHOL, HDL, LDLCALC, TRIG, CHOLHDL, LDLDIRECT in the last 72 hours. Thyroid Function Tests: No results for input(s): TSH, T4TOTAL, FREET4, T3FREE, THYROIDAB in the last 72 hours. Anemia Panel: No results for input(s): VITAMINB12, FOLATE, FERRITIN, TIBC, IRON, RETICCTPCT in the last 72 hours. Urine analysis:    Component Value Date/Time   COLORURINE AMBER (A) 03/10/2019 2300   APPEARANCEUR HAZY (A) 03/10/2019 2300   LABSPEC 1.024 03/10/2019 2300   PHURINE 5.0 03/10/2019 2300   GLUCOSEU NEGATIVE 03/10/2019 2300   HGBUR NEGATIVE 03/10/2019 2300   BILIRUBINUR MODERATE (A) 03/10/2019 2300   KETONESUR NEGATIVE 03/10/2019 2300   PROTEINUR 30 (A) 03/10/2019 2300   NITRITE NEGATIVE 03/10/2019 2300   LEUKOCYTESUR TRACE (A) 03/10/2019 2300   Sepsis Labs: @LABRCNTIP (procalcitonin:4,lacticidven:4) )No results found for this or any previous visit (from the past 240 hour(s)).   Radiological Exams on Admission: Ct Abdomen Pelvis W Contrast  Result Date: 03/10/2019 CLINICAL DATA:  Abnormal liver function test EXAM: CT ABDOMEN AND PELVIS WITH CONTRAST TECHNIQUE: Multidetector CT imaging of the abdomen and pelvis was performed using the standard protocol following bolus administration of intravenous contrast. CONTRAST:  100mL OMNIPAQUE IOHEXOL 300 MG/ML  SOLN COMPARISON:  None. FINDINGS: Lower chest: No acute abnormality. Hepatobiliary: No focal hepatic abnormality. Status post cholecystectomy. Mild periportal edema. Hazy edema at the porta hepatis. No biliary dilatation. No definitive calcified stones in the region of the common duct. Pancreas: Unremarkable. No pancreatic ductal dilatation or surrounding inflammatory changes. Spleen: Normal in size without  focal abnormality. Adrenals/Urinary Tract: Adrenal glands are normal. No hydronephrosis. Subcentimeter hypodensities within the kidneys too small to further characterize. The bladder is nearly empty Stomach/Bowel: Stomach is within normal limits. Appendix appears normal. No evidence of bowel wall thickening, distention, or inflammatory changes. Sigmoid colon diverticula without acute inflammatory process Vascular/Lymphatic: Mild aortic atherosclerosis. No aneurysm. Peripancreatic and porta hepatis lymph nodes measuring up to 19 mm Reproductive: Uterus and bilateral adnexa are unremarkable. Other: Small free fluid in the pelvis.  No free air Musculoskeletal: No acute or significant osseous findings. IMPRESSION: 1. Status post cholecystectomy.  Negative for biliary dilatation. 2. There is mild periportal edema and generalized edema/inflammation at the porta hepatis. Differential considerations include hepatitis and biliary tract infection although there is no significant biliary dilatation. 3. Sigmoid colon diverticular disease without acute inflammation Electronically Signed   By: Jasmine PangKim  Fujinaga M.D.   On: 03/10/2019 23:53     Assessment/Plan Principal Problem:   Acute hepatitis Active Problems:   Jaundice    1. Acute hepatitis/jaundice -cause not clear.  Will check acute hepatitis panel repeat Tylenol levels check ANA anti-smooth muscle  antibodies to check for acute autoimmune hepatitis check antimitochondrial antibodies and ceruloplasmin level follow LFTs consult gastroenterologist in the morning.  Will get blood cultures but patient is afebrile and will hold off antibiotics.  Follow INR. 2. Positive salicylate levels but patient denies taking any form of salicylates.  Will repeat salicylate level will talk with poison control.  COVID-19 test is pending.   DVT prophylaxis: SCDs because of abnormal LFTs and need to follow INR. Code Status: Full code. Family Communication: Discussed with patient.  Disposition Plan: Home. Consults called: None. Admission status: Observation.   Rise Patience MD [facility] Pager [phone].  If 7PM-7AM, please contact night-coverage [email or web address] Password [facility]  03/11/2019, 1:41 AM

## 2019-03-11 NOTE — Consult Note (Signed)
Subjective:   HPI  The patient is a 58 year old female in generally good health who was admitted to the hospital after she was found to have elevated liver enzymes by her primary care physician.  The patient states that she has been noticing some progressive jaundice over the past week and a half.  She denies drinking alcohol.  She has been having some right upper quadrant discomfort recently with some nausea.  About 4 weeks ago her PCP prescribed Naprosyn and a muscle relaxant for neck pain then she started on a course of prednisone.  She also took some Percocet.  She has never had hepatitis.  She has not been around anyone with hepatitis to her knowledge.  Her total bilirubin on admission is 24.7.  Alkaline phosphatase 198.  ALT 897.  AST 924.  Denies excessive Tylenol use.  Overall she feels well.   History reviewed. No pertinent past medical history. Past Surgical History:  Procedure Laterality Date  . CHOLECYSTECTOMY    . TUBAL LIGATION     Social History   Socioeconomic History  . Marital status: Divorced    Spouse name: Not on file  . Number of children: Not on file  . Years of education: Not on file  . Highest education level: Not on file  Occupational History  . Not on file  Social Needs  . Financial resource strain: Not on file  . Food insecurity    Worry: Not on file    Inability: Not on file  . Transportation needs    Medical: Not on file    Non-medical: Not on file  Tobacco Use  . Smoking status: Former Smoker    Types: Cigarettes, E-cigarettes    Quit date: 04/28/2018    Years since quitting: 0.8  . Smokeless tobacco: Never Used  . Tobacco comment: Quit smoking cigarettes 04/2018; is vaping  Substance and Sexual Activity  . Alcohol use: Yes    Comment: wine rarely  . Drug use: Not on file  . Sexual activity: Not on file  Lifestyle  . Physical activity    Days per week: Not on file    Minutes per session: Not on file  . Stress: Not on file  Relationships  .  Social Musicianconnections    Talks on phone: Not on file    Gets together: Not on file    Attends religious service: Not on file    Active member of club or organization: Not on file    Attends meetings of clubs or organizations: Not on file    Relationship status: Not on file  . Intimate partner violence    Fear of current or ex partner: Not on file    Emotionally abused: Not on file    Physically abused: Not on file    Forced sexual activity: Not on file  Other Topics Concern  . Not on file  Social History Narrative  . Not on file   family history includes Breast cancer (age of onset: 1356) in her sister.  Current Facility-Administered Medications:  .  ondansetron (ZOFRAN) tablet 4 mg, 4 mg, Oral, Q6H PRN **OR** ondansetron (ZOFRAN) injection 4 mg, 4 mg, Intravenous, Q6H PRN, Eduard ClosKakrakandy, Arshad N, MD .  Melene Muller[START ON 03/12/2019] pneumococcal 23 valent vaccine (PNU-IMMUNE) injection 0.5 mL, 0.5 mL, Intramuscular, Tomorrow-1000, Kakrakandy, Arshad N, MD .  sodium chloride flush (NS) 0.9 % injection 3 mL, 3 mL, Intravenous, Once, Eduard ClosKakrakandy, Arshad N, MD No Known Allergies   Objective:  BP 121/74 (BP Location: Left Arm)   Pulse (!) 58   Temp 98.1 F (36.7 C) (Oral)   Resp 18   Ht 5\' 4"  (1.626 m)   Wt 113.8 kg   SpO2 100%   BMI 43.07 kg/m   Alert and oriented  No acute distress  Jaundiced  Heart regular rhythm no murmurs  Lungs clear  Abdomen soft and nontender  CT scan shows status post cholecystectomy negative for biliary dilatation.  There is mild periportal edema and generalized edema and inflammation of the porta hepatis.  Laboratory No components found for: D1    Assessment:     Hepatitis etiology unclear      Plan:     The patient is a 58 year old with hepatitis of uncertain etiology.  Serologies pending.  Supportive care for now.  She clinically does not look ill and I am not sure that she needs to be in the hospital.

## 2019-03-11 NOTE — Progress Notes (Signed)
Patient gone to [employer]. Will start K runs when she returns

## 2019-03-11 NOTE — Progress Notes (Signed)
CRITICAL VALUE ALERT  Critical Value:  Total Bilirubin 24.7  Date & Time Notied:  03/11/19  0415  Provider Notified: Schorr, NP  Orders Received/Actions taken: No new orders at this time.

## 2019-03-12 LAB — CBC WITH DIFFERENTIAL/PLATELET
Abs Immature Granulocytes: 0 10*3/uL (ref 0.00–0.07)
Basophils Absolute: 0.1 10*3/uL (ref 0.0–0.1)
Basophils Relative: 1 %
Eosinophils Absolute: 0.2 10*3/uL (ref 0.0–0.5)
Eosinophils Relative: 4 %
HCT: 34.9 % — ABNORMAL LOW (ref 36.0–46.0)
Hemoglobin: 11.6 g/dL — ABNORMAL LOW (ref 12.0–15.0)
Lymphocytes Relative: 16 %
Lymphs Abs: 0.8 10*3/uL (ref 0.7–4.0)
MCH: 28.7 pg (ref 26.0–34.0)
MCHC: 33.2 g/dL (ref 30.0–36.0)
MCV: 86.4 fL (ref 80.0–100.0)
Monocytes Absolute: 0.6 10*3/uL (ref 0.1–1.0)
Monocytes Relative: 12 %
Neutro Abs: 3.6 10*3/uL (ref 1.7–7.7)
Neutrophils Relative %: 67 %
Platelets: 221 10*3/uL (ref 150–400)
RBC: 4.04 MIL/uL (ref 3.87–5.11)
RDW: 21.2 % — ABNORMAL HIGH (ref 11.5–15.5)
WBC: 5.3 10*3/uL (ref 4.0–10.5)
nRBC: 0 % (ref 0.0–0.2)
nRBC: 0 /100 WBC

## 2019-03-12 LAB — HEPATITIS PANEL, ACUTE
HCV Ab: <0.1 s/co ratio (ref 0.0–0.9)
HCV Ab: <0.1 s/co ratio (ref 0.0–0.9)
Hep A IgM: NEGATIVE
Hep A IgM: NEGATIVE
Hep B C IgM: POSITIVE — AB
Hep B C IgM: POSITIVE — AB
Hepatitis B Surface Ag: POSITIVE — AB
Hepatitis B Surface Ag: POSITIVE — AB

## 2019-03-12 LAB — COMPREHENSIVE METABOLIC PANEL
ALT: 829 U/L — ABNORMAL HIGH (ref 0–44)
AST: 928 U/L — ABNORMAL HIGH (ref 15–41)
Albumin: 2 g/dL — ABNORMAL LOW (ref 3.5–5.0)
Alkaline Phosphatase: 163 U/L — ABNORMAL HIGH (ref 38–126)
Anion gap: 8 (ref 5–15)
BUN: 6 mg/dL (ref 6–20)
CO2: 22 mmol/L (ref 22–32)
Calcium: 8.6 mg/dL — ABNORMAL LOW (ref 8.9–10.3)
Chloride: 107 mmol/L (ref 98–111)
Creatinine, Ser: 0.83 mg/dL (ref 0.44–1.00)
GFR calc Af Amer: >60 mL/min (ref >60)
GFR calc non Af Amer: >60 mL/min (ref >60)
Glucose, Bld: 97 mg/dL (ref 70–99)
Potassium: 3.4 mmol/L — ABNORMAL LOW (ref 3.5–5.1)
Sodium: 137 mmol/L (ref 135–145)
Total Bilirubin: 22.1 mg/dL (ref 0.3–1.2)
Total Protein: 6 g/dL — ABNORMAL LOW (ref 6.5–8.1)

## 2019-03-12 LAB — BLOOD CULTURE ID PANEL (REFLEXED)

## 2019-03-12 LAB — MITOCHONDRIAL ANTIBODIES: Mitochondrial M2 Ab, IgG: <20 Units (ref 0.0–20.0)

## 2019-03-12 LAB — ANA: Anti Nuclear Antibody (ANA): POSITIVE — AB

## 2019-03-12 LAB — CERULOPLASMIN: Ceruloplasmin: 32 mg/dL (ref 19.0–39.0)

## 2019-03-12 LAB — ANTI-SMOOTH MUSCLE ANTIBODY, IGG: F-Actin IgG: 37 Units — ABNORMAL HIGH (ref 0–19)

## 2019-03-12 MED ORDER — ONDANSETRON HCL 4 MG PO TABS: 4.0000 mg | ORAL_TABLET | Freq: Four times a day (QID) | ORAL | 0 refills | Status: AC | PRN

## 2019-03-12 NOTE — Progress Notes (Signed)
Pt expected to discharge with family. Pt a/o X 4, VSS, currently showing no signs of distress. MD aware of discharge. All belongings returned to pt. RN to continue to monitor.

## 2019-03-12 NOTE — Progress Notes (Signed)
PHARMACY - PHYSICIAN COMMUNICATION CRITICAL VALUE ALERT - BLOOD CULTURE IDENTIFICATION (BCID)  Kayla Atkinson is an 58 y.o. female who presented to [facility] on 03/10/2019 with a chief complaint of elevated LFTs  Assessment:  1/4 BC pos for Staph spec, no methicillin resistance.  Likely contaminant  Name of physician (or Provider) Contacted: K Schorr Current antibiotics: none  Changes to prescribed antibiotics recommended:  none  Results for orders placed or performed during the hospital encounter of 03/10/19  Blood Culture ID Panel (Reflexed) (Collected: 03/11/2019  6:37 AM)  Result Value Ref Range   Enterococcus species NOT DETECTED NOT DETECTED   Listeria monocytogenes NOT DETECTED NOT DETECTED   Staphylococcus species DETECTED (A) NOT DETECTED   Staphylococcus aureus (BCID) NOT DETECTED NOT DETECTED   Methicillin resistance NOT DETECTED NOT DETECTED   Streptococcus species NOT DETECTED NOT DETECTED   Streptococcus agalactiae NOT DETECTED NOT DETECTED   Streptococcus pneumoniae NOT DETECTED NOT DETECTED   Streptococcus pyogenes NOT DETECTED NOT DETECTED   Acinetobacter baumannii NOT DETECTED NOT DETECTED   Enterobacteriaceae species NOT DETECTED NOT DETECTED   Enterobacter cloacae complex NOT DETECTED NOT DETECTED   Escherichia coli NOT DETECTED NOT DETECTED   Klebsiella oxytoca NOT DETECTED NOT DETECTED   Klebsiella pneumoniae NOT DETECTED NOT DETECTED   Proteus species NOT DETECTED NOT DETECTED   Serratia marcescens NOT DETECTED NOT DETECTED   Haemophilus influenzae NOT DETECTED NOT DETECTED   Neisseria meningitidis NOT DETECTED NOT DETECTED   Pseudomonas aeruginosa NOT DETECTED NOT DETECTED   Candida albicans NOT DETECTED NOT DETECTED   Candida glabrata NOT DETECTED NOT DETECTED   Candida krusei NOT DETECTED NOT DETECTED   Candida parapsilosis NOT DETECTED NOT DETECTED   Candida tropicalis NOT DETECTED NOT DETECTED    [name] 03/12/2019  6:05 AM

## 2019-03-12 NOTE — Discharge Summary (Signed)
Physician Discharge Summary  Kayla Atkinson FXT:[id number] DOB: September 01, 1960 DOA: 03/10/2019  PCP: Leeroy Cha, MD  Admit date: 03/10/2019 Discharge date: 03/12/2019  Recommendations for Outpatient Follow-up:  1. Follow up with PCP in 7-10 days. 2. Have liver function test drawn on 03/19/2019 and have them reported to PCP. 3. Practice good hand and food hygeine using care not to share foods with others.   Discharge Diagnoses: Principal diagnosis is #1 1. Acute Hepatitis B with jaundice  Discharge Condition: Fair Disposition: Home  Diet recommendation: Regular  Filed Weights   03/11/19 0220  Weight: 113.8 kg    History of present illness: Kayla Atkinson is a 58 y.o. female with no significant past medical history was referred to the ER after patient's LFTs were found to be markedly elevated.  Patient states around 4 weeks ago patient had gone to her PCP and was prescribed naproxen and muscle relaxant for neck neck pain patient also was given a course of prednisone.  Patient eventually was referred to orthopedics because x-ray was showing degenerative changes and patient was given a course of prednisone which patient almost completed 2 weeks ago.  Patient also was prescribed Percocet 5/325 which patient states she only took half a pill once 2 weeks ago.  Has not taken any new pills over the last 2 weeks.  Over the last 3 weeks patient has been noticing increasing abdominal pain on eating and last 1 week has been noticing jaundice.  Had gone to her primary care physician 2 days ago and had labs drawn which showed elevated LFTs and was referred to the ER.  Denies any fever chills recent travel recent sick contacts denies any nausea vomiting diarrhea.   Hospital Course:  The patient was admitted to a medical bed. Hepatitis panel was drawn and was positive for acute hepatitis B. Right upper quadrant ultrasound was performed and was negative. GI was consulted. They have cleared the patient  for discharge to home. The patient will be discharged to home in fair condition. She will follow up with her PCP in 7-10 days and have her LFT's rechecked in a week.  Today's assessment: S: The patient is sitting up at bedside. No new complaints. O: Vitals:  Vitals:   03/11/19 2155 03/12/19 0450  BP: (!) 122/58 112/66  Pulse: 68 67  Resp:    Temp: 98.2 F (36.8 C) 98.5 F (36.9 C)  SpO2: 99% 97%    Constitutional:  The patient is awake, alert, and oriented x 3. No acute distress. Eyes:   Sclera are icteric. Respiratory:   No increased work of breathing.  No wheezes, rales, or rhonchi.  No tactile fremitus. Cardiovascular:   Regular rate and rhythm.  No murmurs, ectopy, or gallups  No LE extremity edema    Normal pedal pulses Abdomen:   Abdomen is soft, non-tender, non-distended  No hernias, masses, or organomegaly.  Normoactive bowel sound.s Musculoskeletal:   No cyanosis, clubbing, or edema Skin:   No rashes, lesions, ulcers  palpation of skin: no induration or nodules Neurologic:   CN 2-12 intact  Sensation all 4 extremities intact Psychiatric:   judgement and insight appear normal  Mental status o Mood, affect appropriate o Orientation to person, place, time  Discharge Instructions  Discharge Instructions    Activity as tolerated - No restrictions   Complete by: As directed    Call MD for:  persistant nausea and vomiting   Complete by: As directed    Call MD for:  severe  uncontrolled pain   Complete by: As directed    Diet - low sodium heart healthy   Complete by: As directed    Discharge instructions   Complete by: As directed    Follow up with PCP in 7-10 days. Practice good hand and food hygeine with frequent hand washing and taking care not to contaminate foods. Do not share foods with others.  Liver function tests to be checked in one week with results reported to PCP.   Increase activity slowly   Complete by: As directed        Allergies as of 03/12/2019   No Known Allergies     Medication List    TAKE these medications   ondansetron 4 MG tablet Commonly known as: ZOFRAN Take 1 tablet (4 mg total) by mouth every 6 (six) hours as needed for nausea.      No Known Allergies  The results of significant diagnostics from this hospitalization (including imaging, microbiology, ancillary and laboratory) are listed below for reference.    Significant Diagnostic Studies: Ct Abdomen Pelvis W Contrast  Result Date: 03/10/2019 CLINICAL DATA:  Abnormal liver function test EXAM: CT ABDOMEN AND PELVIS WITH CONTRAST TECHNIQUE: Multidetector CT imaging of the abdomen and pelvis was performed using the standard protocol following bolus administration of intravenous contrast. CONTRAST:  100mL OMNIPAQUE IOHEXOL 300 MG/ML  SOLN COMPARISON:  None. FINDINGS: Lower chest: No acute abnormality. Hepatobiliary: No focal hepatic abnormality. Status post cholecystectomy. Mild periportal edema. Hazy edema at the porta hepatis. No biliary dilatation. No definitive calcified stones in the region of the common duct. Pancreas: Unremarkable. No pancreatic ductal dilatation or surrounding inflammatory changes. Spleen: Normal in size without focal abnormality. Adrenals/Urinary Tract: Adrenal glands are normal. No hydronephrosis. Subcentimeter hypodensities within the kidneys too small to further characterize. The bladder is nearly empty Stomach/Bowel: Stomach is within normal limits. Appendix appears normal. No evidence of bowel wall thickening, distention, or inflammatory changes. Sigmoid colon diverticula without acute inflammatory process Vascular/Lymphatic: Mild aortic atherosclerosis. No aneurysm. Peripancreatic and porta hepatis lymph nodes measuring up to 19 mm Reproductive: Uterus and bilateral adnexa are unremarkable. Other: Small free fluid in the pelvis.  No free air Musculoskeletal: No acute or significant osseous findings. IMPRESSION: 1.  Status post cholecystectomy.  Negative for biliary dilatation. 2. There is mild periportal edema and generalized edema/inflammation at the porta hepatis. Differential considerations include hepatitis and biliary tract infection although there is no significant biliary dilatation. 3. Sigmoid colon diverticular disease without acute inflammation Electronically Signed   By: Jasmine PangKim  Fujinaga M.D.   On: 03/10/2019 23:53   [place] Abdomen Limited Ruq  Result Date: 03/11/2019 CLINICAL DATA:  Jaundice EXAM: ULTRASOUND ABDOMEN LIMITED RIGHT UPPER QUADRANT COMPARISON:  None. FINDINGS: Gallbladder: Surgically absent. Common bile duct: Diameter: 7.5 mm Liver: No focal lesion identified. Within normal limits in parenchymal echogenicity. Portal vein is patent on color Doppler imaging with normal direction of blood flow towards the liver. IMPRESSION: 1. The common bile duct is mildly prominent measuring 7.5 mm which is not an abnormal finding in the setting of previous cholecystectomy. If there is continued concern for biliary obstruction, an ERCP or MRCP could better evaluate. Electronically Signed   By: Gerome Samavid  Williams III M.D   On: 03/11/2019 11:19    Microbiology: Recent Results (from the past 240 hour(s))  Culture, blood (routine x 2)     Status: None (Preliminary result)   Collection Time: 03/11/19  6:37 AM   Specimen: BLOOD LEFT HAND  Result  Value Ref Range Status   Specimen Description BLOOD LEFT HAND  Final   Special Requests   Final    BOTTLES DRAWN AEROBIC AND ANAEROBIC Blood Culture adequate volume   Culture  Setup Time   Final    GRAM POSITIVE COCCI AEROBIC BOTTLE ONLY Organism ID to follow CRITICAL RESULT CALLED TO, READ BACK BY AND VERIFIED WITH: PHRMD L SEAY @0602  03/12/19 BY S GEZAHEGN Performed at [facility], [address] [place], [place] [address]    Culture PENDING  Incomplete   Report Status PENDING  Incomplete  Blood Culture ID Panel (Reflexed)     Status: Abnormal   Collection  Time: 03/11/19  6:37 AM  Result Value Ref Range Status   Enterococcus species NOT DETECTED NOT DETECTED Final   Listeria monocytogenes NOT DETECTED NOT DETECTED Final   Staphylococcus species DETECTED (A) NOT DETECTED Final    Comment: Methicillin (oxacillin) susceptible coagulase negative staphylococcus. Possible blood culture contaminant (unless isolated from more than one blood culture draw or clinical case suggests pathogenicity). No antibiotic treatment is indicated for blood  culture contaminants. CRITICAL RESULT CALLED TO, READ BACK BY AND VERIFIED WITH: PHRMD L SEAY @0602  03/12/19 BY S GEZAHEGN    Staphylococcus aureus (BCID) NOT DETECTED NOT DETECTED Final   Methicillin resistance NOT DETECTED NOT DETECTED Final   Streptococcus species NOT DETECTED NOT DETECTED Final   Streptococcus agalactiae NOT DETECTED NOT DETECTED Final   Streptococcus pneumoniae NOT DETECTED NOT DETECTED Final   Streptococcus pyogenes NOT DETECTED NOT DETECTED Final   Acinetobacter baumannii NOT DETECTED NOT DETECTED Final   Enterobacteriaceae species NOT DETECTED NOT DETECTED Final   Enterobacter cloacae complex NOT DETECTED NOT DETECTED Final   Escherichia coli NOT DETECTED NOT DETECTED Final   Klebsiella oxytoca NOT DETECTED NOT DETECTED Final   Klebsiella pneumoniae NOT DETECTED NOT DETECTED Final   Proteus species NOT DETECTED NOT DETECTED Final   Serratia marcescens NOT DETECTED NOT DETECTED Final   Haemophilus influenzae NOT DETECTED NOT DETECTED Final   Neisseria meningitidis NOT DETECTED NOT DETECTED Final   Pseudomonas aeruginosa NOT DETECTED NOT DETECTED Final   Candida albicans NOT DETECTED NOT DETECTED Final   Candida glabrata NOT DETECTED NOT DETECTED Final   Candida krusei NOT DETECTED NOT DETECTED Final   Candida parapsilosis NOT DETECTED NOT DETECTED Final   Candida tropicalis NOT DETECTED NOT DETECTED Final    Comment: Performed at [facility], [address] [place],  [place] [address]     Labs: Basic Metabolic Panel: Recent Labs  Lab 03/10/19 1614 03/11/19 0213 03/12/19 0247  NA 133* 135 137  K 3.5 3.3* 3.4*  CL 102 103 107  CO2 20* 22 22  GLUCOSE 108* 99 97  BUN 11 9 6   CREATININE 0.90 0.85 0.83  CALCIUM 8.9 8.8* 8.6*   Liver Function Tests: Recent Labs  Lab 03/10/19 1614 03/11/19 0213 03/12/19 0247  AST 900* 924* 928*  ALT 869* 897* 829*  ALKPHOS 206* 198* 163*  BILITOT 24.6* 24.7* 22.1*  PROT 7.1 6.8 6.0*  ALBUMIN 2.5* 2.4* 2.0*   Recent Labs  Lab 03/10/19 1614  LIPASE 44   No results for input(s): AMMONIA in the last 168 hours. CBC: Recent Labs  Lab 03/10/19 1614 03/11/19 0213 03/12/19 0247  WBC 6.5 6.4 5.3  NEUTROABS  --  3.2 3.6  HGB 13.4 12.7 11.6*  HCT 39.5 37.5 34.9*  MCV 85.3 84.7 86.4  PLT 258 239 221   Cardiac  Enzymes: No results for input(s): CKTOTAL, CKMB, CKMBINDEX, TROPONINI in the last 168 hours. BNP: BNP (last 3 results) No results for input(s): BNP in the last 8760 hours.  ProBNP (last 3 results) No results for input(s): PROBNP in the last 8760 hours.  CBG: No results for input(s): GLUCAP in the last 168 hours.  Principal Problem:   Acute hepatitis Active Problems:   Jaundice   Time coordinating discharge: 38 minutes.  Signed:        Shivan Hodes, DO [facility]  03/12/2019, 12:16 PM

## 2019-03-13 LAB — CULTURE, BLOOD (ROUTINE X 2): Special Requests: ADEQUATE

## 2019-03-14 LAB — NOVEL CORONAVIRUS, NAA (HOSP ORDER, SEND-OUT TO REF LAB; TAT 18-24 HRS): SARS-CoV-2, NAA: NOT DETECTED

## 2019-03-16 LAB — CULTURE, BLOOD (ROUTINE X 2)
Culture: NO GROWTH
Special Requests: ADEQUATE

## 2019-03-18 ENCOUNTER — Telehealth: Payer: Self-pay | Admitting: Internal Medicine

## 2019-03-18 NOTE — Telephone Encounter (Signed)
COVID-19 Pre-Screening Questions:03/18/19 ° °Do you currently have a fever (>100 °F), chills or unexplained body aches? NO ° °Are you currently experiencing new cough, shortness of breath, sore throat, runny nose? NO °•  °Have you recently travelled outside the state of [place] in the last 14 days? NO  °•  °Have you been in contact with someone that is currently pending confirmation of Covid19 testing or has been confirmed to have the Covid19 virus?  NO  ° °**If the patient answers NO to ALL questions -  advise the patient to please call the clinic before coming to the office should any symptoms develop.  ° ° °

## 2019-03-19 ENCOUNTER — Encounter: Payer: Self-pay | Admitting: Internal Medicine

## 2019-03-19 ENCOUNTER — Ambulatory Visit: Payer: BC Managed Care – PPO | Admitting: Internal Medicine

## 2019-03-19 ENCOUNTER — Other Ambulatory Visit: Payer: Self-pay

## 2019-03-19 VITALS — BP 109/76 | HR 87 | Temp 98.7°F | Wt 253.0 lb

## 2019-03-19 DIAGNOSIS — B179 Acute viral hepatitis, unspecified: Secondary | ICD-10-CM

## 2019-03-19 DIAGNOSIS — B161 Acute hepatitis B with delta-agent without hepatic coma: Secondary | ICD-10-CM | POA: Diagnosis not present

## 2019-03-19 NOTE — Progress Notes (Signed)
RFV: Acute hepatitis B  Patient ID: Kayla Atkinson, female   DOB: 02/24/1961, 58 y.o.   MRN: [id number]  HPI Kayla Atkinson is a 58yo F with was recently hospitalized for acute jaundice last week. Still reports significant fatigue and itchiness, still acutely jaundiced, feeling poorly and nauseated. Some of work-up for acute hepatitis includes hep B+ but also underwent work up for auto immune process. Does not know if she has contact with anyone with acute hep B.   Today lab work at PCP sub, [place] - ast 455, alt 500, tbili 27.  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 ondansetron (ZOFRAN) 4 MG tablet Take 1 tablet (4 mg total) by mouth every 6 (six) hours as needed for nausea. (Patient not taking: Reported on 03/19/2019)   No facility-administered encounter medications on file as of 03/19/2019.      Patient Active Problem List   Diagnosis Date Noted  . Acute hepatitis 03/11/2019  . Jaundice 03/11/2019     Health Maintenance Due  Topic Date Due  . TETANUS/TDAP  10/07/1978  . PAP SMEAR-Modifier  10/07/1980  . COLONOSCOPY  10/07/2009    Social History   Tobacco Use  . Smoking status: Former Smoker    Types: Cigarettes, E-cigarettes    Quit date: 04/28/2018    Years since quitting: 0.9  . Smokeless tobacco: Never Used  . Tobacco comment: Quit smoking cigarettes 04/2018; is vaping  Substance Use Topics  . Alcohol use: Yes    Comment: wine rarely  . Drug use: Not on file   family history includes Breast cancer (age of onset: 43) in her sister.  Review of Systems 12 point ros is negative except what is mentioned in hpi Physical Exam   BP 109/76   Pulse 87   Temp 98.7 F (37.1 C) (Oral)   Wt 253 lb (114.8 kg)   BMI 43.43 kg/m   Physical Exam  Constitutional:  oriented to person, place, and time. appears well-developed and well-nourished. No distress.  HENT: [place]/AT, PERRLA, + scleral  icterus Mouth/Throat: Oropharynx is clear and moist. No oropharyngeal exudate.  Cardiovascular: Normal rate, regular rhythm and normal heart sounds. Exam reveals no gallop and no friction rub.  No murmur heard.  Pulmonary/Chest: Effort normal and breath sounds normal. No respiratory distress.  has no wheezes.  Neck = supple, no nuchal rigidity Abdominal: Soft. Bowel sounds are normal.  exhibits no distension. There is no tenderness.  Lymphadenopathy: no cervical adenopathy. No axillary adenopathy Neurological: alert and oriented to person, place, and time.  Skin: Skin is warm and dry. No rash noted. No erythema.  Psychiatric: a normal mood and affect.  behavior is normal.      CBC Lab Results  Component Value Date   WBC 5.3 03/12/2019   RBC 4.04 03/12/2019   HGB 11.6 (L) 03/12/2019   HCT 34.9 (L) 03/12/2019   PLT 221 03/12/2019   MCV 86.4 03/12/2019   MCH 28.7 03/12/2019   MCHC 33.2 03/12/2019   RDW 21.2 (H) 03/12/2019   LYMPHSABS 0.8 03/12/2019   MONOABS 0.6 03/12/2019   EOSABS 0.2 03/12/2019    BMET Lab Results  Component Value Date   NA 137 03/12/2019   K 3.4 (L) 03/12/2019   CL 107 03/12/2019   CO2 22 03/12/2019   GLUCOSE 97 03/12/2019   BUN 6 03/12/2019   CREATININE 0.83 03/12/2019   CALCIUM 8.6 (L) 03/12/2019  GFRNONAA >60 03/12/2019   GFRAA >60 03/12/2019      Assessment and Plan  Acute hepatitis acute hep vs. Auto-immune = unclear if source is her significant other. May need follow up with gi. Will check hep B viral load. Will see if she clears her viremia over the next few months. Good chance that she may be able to clear infection and may not need to have treatment for hepatitis b.  She is asking for asking for help with paperwork for fmla and short term disability.

## 2019-03-26 DIAGNOSIS — R740 Nonspecific elevation of levels of transaminase and lactic acid dehydrogenase [LDH]: Secondary | ICD-10-CM | POA: Diagnosis not present

## 2019-03-26 DIAGNOSIS — L298 Other pruritus: Secondary | ICD-10-CM | POA: Diagnosis not present

## 2019-03-26 DIAGNOSIS — B169 Acute hepatitis B without delta-agent and without hepatic coma: Secondary | ICD-10-CM | POA: Diagnosis not present

## 2019-03-27 ENCOUNTER — Telehealth: Payer: Self-pay

## 2019-03-27 LAB — HEPATITIS B E ANTIGEN: Hep B E Ag: NONREACTIVE

## 2019-03-27 LAB — ANA: Anti Nuclear Antibody (ANA): NEGATIVE

## 2019-03-27 LAB — ANTI-MICROSOMAL ANTIBODY LIVER / KIDNEY: LKM1 Ab: <=20 U (ref <=20.0)

## 2019-03-27 LAB — HEPATITIS B E ANTIBODY: Hep B E Ab: NONREACTIVE

## 2019-03-27 LAB — HEPATITIS B DNA, ULTRAQUANTITATIVE, PCR
Hepatitis B DNA (Calc): 3.85 Log IU/mL — ABNORMAL HIGH
Hepatitis B DNA: 7100 IU/mL — ABNORMAL HIGH

## 2019-03-27 LAB — PROTIME-INR
INR: 1.6 — ABNORMAL HIGH
Prothrombin Time: 15.8 s — ABNORMAL HIGH (ref 9.0–11.5)

## 2019-03-27 NOTE — Telephone Encounter (Signed)
Patient called office today requesting update on Short term disability paperwork that she brought during her appointment on 7/22.Patient states she handed paper work to MD during appointment and was told it would be ready by Monday 7/27.  Unable to find Short term disability  paperwork in scan file or in MD's box. Patient states her employer has not received paperwork and will need it submitted.  [place]

## 2019-03-28 ENCOUNTER — Telehealth: Payer: Self-pay

## 2019-03-28 NOTE — Telephone Encounter (Signed)
Patient is calling regarding her FMLA forms left on 03-19-2019.   She feels her job and pay may be in jeopardy if they are not completed by designated date of 03-31-2019  I explained to patient Dr Baxter Flattery has been away from the office for an extended period of time which has delayed the completion of her paper work and I will call her employer and inform them we have the paper work but may be delayed in submitting the documents.   I spoke with Ranee Gosselin -(HR [phone]) and informed her of the situation with the required documents for this patient. Maudie Mercury stated since we called with the delay the patient's benefits at this time are not affected and the forms must be completed prior to 03-31-2019.     Once forms are completed they will be faxed to employer and the patient will be informed to pick up her copy.    Return call made to the patient and informed information shared with Kim (HR representative) regarding benefits.   Patient was ensured we will have the paper work completed upon Dr [employer] return on 03-31-2019   Laverle Patter, RN

## 2019-03-31 DIAGNOSIS — B169 Acute hepatitis B without delta-agent and without hepatic coma: Secondary | ICD-10-CM | POA: Diagnosis not present

## 2019-03-31 NOTE — Telephone Encounter (Signed)
Left message on voice mail :  FMLA and Short term disability form faxed to HR department.  Copy for patient to pick up is at the front desk.     Call if you would like me to mail it.    Laverle Patter, RN

## 2019-04-09 ENCOUNTER — Other Ambulatory Visit: Payer: Self-pay

## 2019-04-09 ENCOUNTER — Encounter: Payer: Self-pay | Admitting: Internal Medicine

## 2019-04-09 ENCOUNTER — Ambulatory Visit (INDEPENDENT_AMBULATORY_CARE_PROVIDER_SITE_OTHER): Payer: BC Managed Care – PPO | Admitting: Internal Medicine

## 2019-04-09 ENCOUNTER — Encounter

## 2019-04-09 VITALS — BP 134/84 | HR 70 | Temp 98.4°F

## 2019-04-09 DIAGNOSIS — R17 Unspecified jaundice: Secondary | ICD-10-CM | POA: Diagnosis not present

## 2019-04-09 DIAGNOSIS — T732XXS Exhaustion due to exposure, sequela: Secondary | ICD-10-CM | POA: Diagnosis not present

## 2019-04-09 DIAGNOSIS — B179 Acute viral hepatitis, unspecified: Secondary | ICD-10-CM

## 2019-04-09 NOTE — Progress Notes (Signed)
RFV: follow up for acute hep b Patient ID: Kayla Atkinson, female   DOB: [birth year], 59 y.o.   MRN: [id number]  HPI 58yo F with acute hepatitis b who is still recovering from her initial infection. She is Less jaundice, less itching no n/v, still significant fatigue  Outpatient Encounter Medications as of 04/09/2019  Medication Sig  . cholestyramine (QUESTRAN) 4 GM/DOSE powder MIX 1 SCOOP IN LIQUID AND TK PO BID  . hydrOXYzine (ATARAX/VISTARIL) 25 MG tablet TK 1 T PO Q 8 H PRN  . ondansetron (ZOFRAN) 4 MG tablet Take 1 tablet (4 mg total) by mouth every 6 (six) hours as needed for nausea. (Patient not taking: Reported on 03/19/2019)   No facility-administered encounter medications on file as of 04/09/2019.      Patient Active Problem List   Diagnosis Date Noted  . Acute hepatitis 03/11/2019  . Jaundice 03/11/2019     Health Maintenance Due  Topic Date Due  . TETANUS/TDAP  10/07/1978  . PAP SMEAR-Modifier  10/07/1980  . COLONOSCOPY  10/07/2009  . INFLUENZA VACCINE  03/29/2019     Review of Systems Constitutional: + fatigue.Negative for fever, chills, diaphoresis, activity change, appetite change, fatigue and unexpected weight change.  HENT: Negative for congestion, sore throat, rhinorrhea, sneezing, trouble swallowing and sinus pressure.  Eyes: Negative for photophobia and visual disturbance.  Respiratory: Negative for cough, chest tightness, shortness of breath, wheezing and stridor.  Cardiovascular: Negative for chest pain, palpitations and leg swelling.  Gastrointestinal: Negative for nausea, vomiting, abdominal pain, diarrhea, constipation, blood in stool, abdominal distention and anal bleeding.  Genitourinary: Negative for dysuria, hematuria, flank pain and difficulty urinating.  Musculoskeletal: Negative for myalgias, back pain, joint swelling, arthralgias and gait problem.  Skin: Negative for color change, pallor, rash and wound.  Neurological: Negative for dizziness,  tremors, weakness and light-headedness.  Hematological: Negative for adenopathy. Does not bruise/bleed easily.  Psychiatric/Behavioral: Negative for behavioral problems, confusion, sleep disturbance, dysphoric mood, decreased concentration and agitation.    Physical Exam   BP 134/84   Pulse 70   Temp 98.4 F (36.9 C) (Oral)    Physical Exam  Constitutional:  oriented to person, place, and time. appears well-developed and well-nourished. No distress.  HENT: [place]/AT, PERRLA, + scleral icterus Mouth/Throat: Oropharynx is clear and moist. No oropharyngeal exudate.  Cardiovascular: Normal rate, regular rhythm and normal heart sounds. Exam reveals no gallop and no friction rub.  No murmur heard.  Pulmonary/Chest: Effort normal and breath sounds normal. No respiratory distress.  has no wheezes.  Neck = supple, no nuchal rigidity Abdominal: Soft. Bowel sounds are normal.  exhibits no distension. There is no tenderness.  Lymphadenopathy: no cervical adenopathy. No axillary adenopathy Neurological: alert and oriented to person, place, and time.  Skin: Skin is warm and dry. No rash noted. No erythema.  Psychiatric: a normal mood and affect.  behavior is normal.   CBC Lab Results  Component Value Date   WBC 5.3 03/12/2019   RBC 4.04 03/12/2019   HGB 11.6 (L) 03/12/2019   HCT 34.9 (L) 03/12/2019   PLT 221 03/12/2019   MCV 86.4 03/12/2019   MCH 28.7 03/12/2019   MCHC 33.2 03/12/2019   RDW 21.2 (H) 03/12/2019   LYMPHSABS 0.8 03/12/2019   MONOABS 0.6 03/12/2019   EOSABS 0.2 03/12/2019    BMET Lab Results  Component Value Date   NA 137 03/12/2019   K 3.4 (L) 03/12/2019   CL 107 03/12/2019   CO2 22 03/12/2019  GLUCOSE 97 03/12/2019   BUN 6 03/12/2019   CREATININE 0.83 03/12/2019   CALCIUM 8.6 (L) 03/12/2019   GFRNONAA >60 03/12/2019   GFRAA >60 03/12/2019      Assessment and Plan  Acute hepatitis B with ongoing jaundice Will check labs Needs another 4 wk to recover, will  provide letter for work Still too early to tell if needs chronic hep b treatment  rtc 4 wk

## 2019-04-12 LAB — COMPLETE METABOLIC PANEL WITH GFR
AG Ratio: 0.8 (calc) — ABNORMAL LOW (ref 1.0–2.5)
ALT: 77 U/L — ABNORMAL HIGH (ref 6–29)
AST: 87 U/L — ABNORMAL HIGH (ref 10–35)
Albumin: 3 g/dL — ABNORMAL LOW (ref 3.6–5.1)
Alkaline phosphatase (APISO): 91 U/L (ref 37–153)
BUN: 12 mg/dL (ref 7–25)
CO2: 26 mmol/L (ref 20–32)
Calcium: 8.7 mg/dL (ref 8.6–10.4)
Chloride: 106 mmol/L (ref 98–110)
Creat: 0.87 mg/dL (ref 0.50–1.05)
GFR, Est African American: 85 mL/min/{1.73_m2} (ref >=60)
GFR, Est Non African American: 73 mL/min/{1.73_m2} (ref >=60)
Globulin: 3.7 g/dL (calc) (ref 1.9–3.7)
Glucose, Bld: 94 mg/dL (ref 65–99)
Potassium: 4.1 mmol/L (ref 3.5–5.3)
Sodium: 139 mmol/L (ref 135–146)
Total Bilirubin: 7.5 mg/dL — ABNORMAL HIGH (ref 0.2–1.2)
Total Protein: 6.7 g/dL (ref 6.1–8.1)

## 2019-04-12 LAB — CBC WITH DIFFERENTIAL/PLATELET
Absolute Monocytes: 696 cells/uL (ref 200–950)
Basophils Absolute: 98 cells/uL (ref 0–200)
Basophils Relative: 1.5 %
Eosinophils Absolute: 202 cells/uL (ref 15–500)
Eosinophils Relative: 3.1 %
HCT: 30.8 % — ABNORMAL LOW (ref 35.0–45.0)
Hemoglobin: 10.6 g/dL — ABNORMAL LOW (ref 11.7–15.5)
Lymphs Abs: 2028 cells/uL (ref 850–3900)
MCH: 31.7 pg (ref 27.0–33.0)
MCHC: 34.4 g/dL (ref 32.0–36.0)
MCV: 92.2 fL (ref 80.0–100.0)
MPV: 11.7 fL (ref 7.5–12.5)
Monocytes Relative: 10.7 %
Neutro Abs: 3478 cells/uL (ref 1500–7800)
Neutrophils Relative %: 53.5 %
Platelets: 225 10*3/uL (ref 140–400)
RBC: 3.34 10*6/uL — ABNORMAL LOW (ref 3.80–5.10)
RDW: 17.1 % — ABNORMAL HIGH (ref 11.0–15.0)
Total Lymphocyte: 31.2 %
WBC: 6.5 10*3/uL (ref 3.8–10.8)

## 2019-04-12 LAB — HEPATITIS B DNA, ULTRAQUANTITATIVE, PCR
Hepatitis B DNA (Calc): 3.03 Log IU/mL — ABNORMAL HIGH
Hepatitis B DNA: 1060 IU/mL — ABNORMAL HIGH

## 2019-04-12 LAB — PROTIME-INR
INR: 1
Prothrombin Time: 10.4 s (ref 9.0–11.5)

## 2019-04-12 LAB — HEPATITIS B SURFACE ANTIBODY,QUALITATIVE: Hep B S Ab: NONREACTIVE

## 2019-04-14 ENCOUNTER — Telehealth: Payer: Self-pay | Admitting: *Deleted

## 2019-04-14 NOTE — Telephone Encounter (Signed)
Letter complete and signed noting patient needs to be out of work for 4 weeks from 04/10/19. RN notified patient this is ready for pick up at the front, will also fax a copy to her HR contact, Kim (p: [phone], f: [phone]). Patient will also need FMLA paperwork updated with new date. RN will find previously sent copy to have the date updated. Landis Gandy, RN

## 2019-04-17 NOTE — Telephone Encounter (Signed)
Updated paperwork: out of work 4 weeks form 04/10/19 (05/08/19) and faxed to [facility] [phone]

## 2019-04-21 NOTE — Telephone Encounter (Signed)
Faxed updated FMLA and letter to Maudie Mercury (FMLA representative) at [phone]. Landis Gandy, RN

## 2019-04-22 NOTE — Telephone Encounter (Signed)
Confirmed receipt with Kayla Atkinson - she received everything she needed, has sent it on for approval to [facility]. Landis Gandy, RN

## 2019-04-23 DIAGNOSIS — B169 Acute hepatitis B without delta-agent and without hepatic coma: Secondary | ICD-10-CM | POA: Diagnosis not present

## 2019-04-23 DIAGNOSIS — R945 Abnormal results of liver function studies: Secondary | ICD-10-CM | POA: Diagnosis not present

## 2019-05-07 ENCOUNTER — Ambulatory Visit (INDEPENDENT_AMBULATORY_CARE_PROVIDER_SITE_OTHER): Payer: BC Managed Care – PPO | Admitting: Internal Medicine

## 2019-05-07 ENCOUNTER — Other Ambulatory Visit: Payer: Self-pay

## 2019-05-07 DIAGNOSIS — B179 Acute viral hepatitis, unspecified: Secondary | ICD-10-CM

## 2019-05-07 DIAGNOSIS — T732XXS Exhaustion due to exposure, sequela: Secondary | ICD-10-CM

## 2019-05-07 NOTE — Progress Notes (Addendum)
   RFV: for chronic hepatitis B-televisit (audio only), ms. Ballman consented to doing televisit over the phone. She was reached at home, while I was using office phones to communicate with her.  Patient ID: Kayla Atkinson, female   DOB: 04-13-1961, 58 y.o.   MRN: [id number]  HPI Kayla Atkinson is a 58yo F who was hospitalized for acute hepatitis b infection. She states that her Jaundice improve Fatigue much improved but not back to her baseline; No itching; No n/v  Outpatient Encounter Medications as of 05/07/2019  Medication Sig  . cholestyramine (QUESTRAN) 4 GM/DOSE powder MIX 1 SCOOP IN LIQUID AND TK PO BID  . hydrOXYzine (ATARAX/VISTARIL) 25 MG tablet TK 1 T PO Q 8 H PRN  . ondansetron (ZOFRAN) 4 MG tablet Take 1 tablet (4 mg total) by mouth every 6 (six) hours as needed for nausea. (Patient not taking: Reported on 03/19/2019)   No facility-administered encounter medications on file as of 05/07/2019.      Patient Active Problem List   Diagnosis Date Noted  . Acute hepatitis 03/11/2019  . Jaundice 03/11/2019     Health Maintenance Due  Topic Date Due  . TETANUS/TDAP  10/07/1978  . PAP SMEAR-Modifier  10/07/1980  . COLONOSCOPY  10/07/2009  . INFLUENZA VACCINE  03/29/2019     Review of Systems Review of Systems  Constitutional: Negative for fever, chills, diaphoresis, activity change, appetite change, fatigue and unexpected weight change.  HENT: Negative for congestion, sore throat, rhinorrhea, sneezing, trouble swallowing and sinus pressure.  Eyes: Negative for photophobia and visual disturbance.  Respiratory: Negative for cough, chest tightness, shortness of breath, wheezing and stridor.  Cardiovascular: Negative for chest pain, palpitations and leg swelling.  Gastrointestinal: Negative for nausea, vomiting, abdominal pain, diarrhea, constipation, blood in stool, abdominal distention and anal bleeding.  Genitourinary: Negative for dysuria, hematuria, flank pain and difficulty urinating.   Musculoskeletal: Negative for myalgias, back pain, joint swelling, arthralgias and gait problem.  Skin: Negative for color change, pallor, rash and wound.  Neurological: Negative for dizziness, tremors, weakness and light-headedness.  Hematological: Negative for adenopathy. Does not bruise/bleed easily.  Psychiatric/Behavioral: Negative for behavioral problems, confusion, sleep disturbance, dysphoric mood, decreased concentration and agitation.   PE: No exam due to telehit  Lab Results  Component Value Date   HEPBSAB NON-REACTIVE 04/09/2019   No results found for: RPR, LABRPR  CBC Lab Results  Component Value Date   WBC 6.5 04/09/2019   RBC 3.34 (L) 04/09/2019   HGB 10.6 (L) 04/09/2019   HCT 30.8 (L) 04/09/2019   PLT 225 04/09/2019   MCV 92.2 04/09/2019   MCH 31.7 04/09/2019   MCHC 34.4 04/09/2019   RDW 17.1 (H) 04/09/2019   LYMPHSABS 2,028 04/09/2019   MONOABS 0.6 03/12/2019   EOSABS 202 04/09/2019    BMET Lab Results  Component Value Date   NA 139 04/09/2019   K 4.1 04/09/2019   CL 106 04/09/2019   CO2 26 04/09/2019   GLUCOSE 94 04/09/2019   BUN 12 04/09/2019   CREATININE 0.87 04/09/2019   CALCIUM 8.7 04/09/2019   GFRNONAA 73 04/09/2019   GFRAA 85 04/09/2019      Assessment and Plan Improving. Will check cmp and hep b VL this week Can go back to work but recommend starting 2 wks of part-time then full-time [email or web address]

## 2019-05-08 ENCOUNTER — Encounter: Payer: Self-pay | Admitting: Internal Medicine

## 2019-06-04 DIAGNOSIS — R945 Abnormal results of liver function studies: Secondary | ICD-10-CM | POA: Diagnosis not present

## 2019-06-04 DIAGNOSIS — B169 Acute hepatitis B without delta-agent and without hepatic coma: Secondary | ICD-10-CM | POA: Diagnosis not present

## 2019-06-23 DIAGNOSIS — B169 Acute hepatitis B without delta-agent and without hepatic coma: Secondary | ICD-10-CM | POA: Diagnosis not present

## 2019-07-02 DIAGNOSIS — Z01411 Encounter for gynecological examination (general) (routine) with abnormal findings: Secondary | ICD-10-CM | POA: Diagnosis not present

## 2019-07-02 DIAGNOSIS — N879 Dysplasia of cervix uteri, unspecified: Secondary | ICD-10-CM | POA: Diagnosis not present

## 2020-07-02 DIAGNOSIS — Z23 Encounter for immunization: Secondary | ICD-10-CM | POA: Diagnosis not present

## 2020-07-02 DIAGNOSIS — Z8619 Personal history of other infectious and parasitic diseases: Secondary | ICD-10-CM | POA: Diagnosis not present

## 2020-08-31 DIAGNOSIS — L308 Other specified dermatitis: Secondary | ICD-10-CM | POA: Diagnosis not present

## 2020-08-31 DIAGNOSIS — L309 Dermatitis, unspecified: Secondary | ICD-10-CM | POA: Diagnosis not present

## 2020-08-31 DIAGNOSIS — R21 Rash and other nonspecific skin eruption: Secondary | ICD-10-CM | POA: Diagnosis not present

## 2020-09-29 DIAGNOSIS — Z01419 Encounter for gynecological examination (general) (routine) without abnormal findings: Secondary | ICD-10-CM | POA: Diagnosis not present

## 2020-09-29 DIAGNOSIS — Z1211 Encounter for screening for malignant neoplasm of colon: Secondary | ICD-10-CM | POA: Diagnosis not present

## 2020-10-12 DIAGNOSIS — R21 Rash and other nonspecific skin eruption: Secondary | ICD-10-CM | POA: Diagnosis not present

## 2020-10-12 DIAGNOSIS — D485 Neoplasm of uncertain behavior of skin: Secondary | ICD-10-CM | POA: Diagnosis not present

## 2020-10-12 DIAGNOSIS — L309 Dermatitis, unspecified: Secondary | ICD-10-CM | POA: Diagnosis not present

## 2020-10-12 DIAGNOSIS — L308 Other specified dermatitis: Secondary | ICD-10-CM | POA: Diagnosis not present

## 2020-11-30 DIAGNOSIS — R87612 Low grade squamous intraepithelial lesion on cytologic smear of cervix (LGSIL): Secondary | ICD-10-CM | POA: Diagnosis not present

## 2020-11-30 DIAGNOSIS — B977 Papillomavirus as the cause of diseases classified elsewhere: Secondary | ICD-10-CM | POA: Diagnosis not present

## 2020-12-01 DIAGNOSIS — Z13220 Encounter for screening for lipoid disorders: Secondary | ICD-10-CM | POA: Diagnosis not present

## 2020-12-01 DIAGNOSIS — Z0000 Encounter for general adult medical examination without abnormal findings: Secondary | ICD-10-CM | POA: Diagnosis not present

## 2021-07-12 IMAGING — CT CT ABDOMEN AND PELVIS WITH CONTRAST
2 of 5 series · 16 of 46 positions shown, 18 images · IV contrast (Omni 300)
Comparison: None.

CLINICAL DATA: Abnormal liver function test

EXAM:
CT ABDOMEN AND PELVIS WITH CONTRAST
TECHNIQUE: Multidetector CT imaging of the abdomen and pelvis was performed
using the standard protocol following bolus administration of
intravenous contrast.
CONTRAST:  100mL OMNIPAQUE IOHEXOL 300 MG/ML  SOLN

[Series 3: a/p w/ 5mm · axial · 0.98mm/px · z∈[+737,+1167]mm · 13 of 96 slices shown, 15 images]
[im 5/96  soft-tissue]
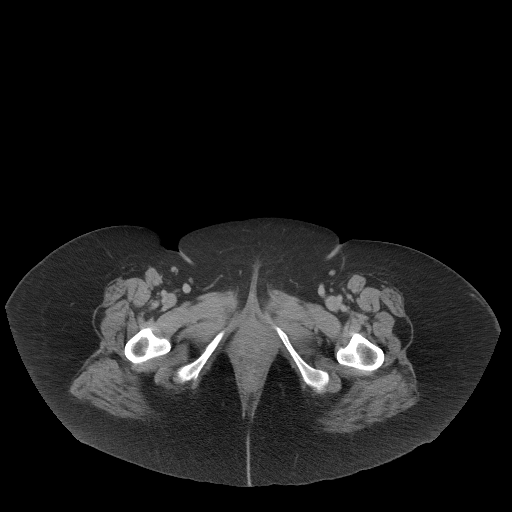
[im 5/96  bone]
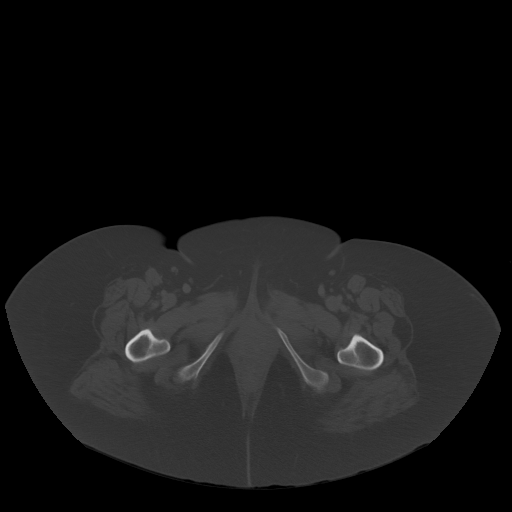
[im 15/96  soft-tissue]
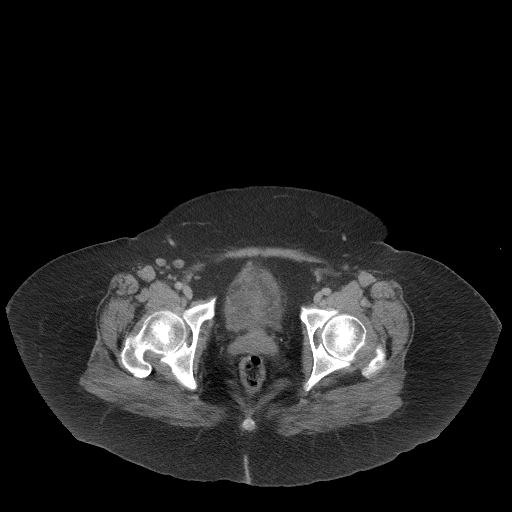
[im 20/96  soft-tissue]
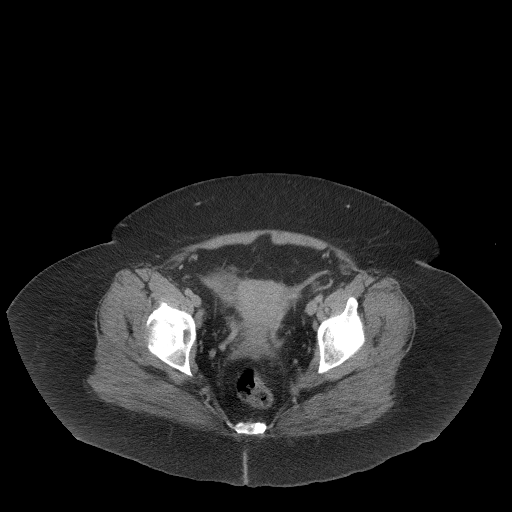
[im 29/96  soft-tissue]
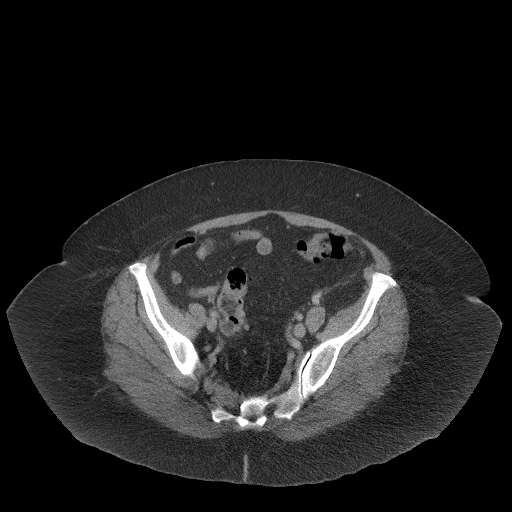
[im 34/96  soft-tissue]
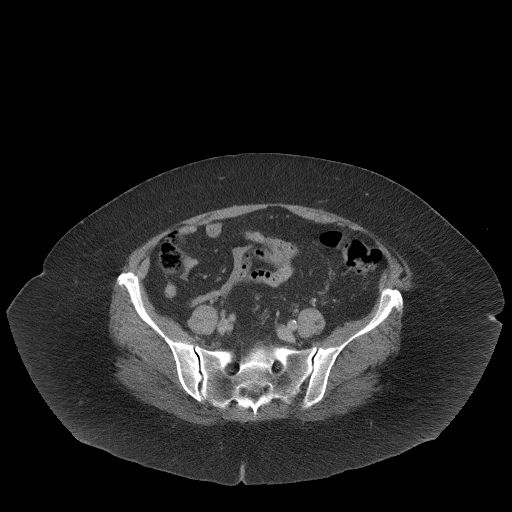
[im 43/96  soft-tissue]
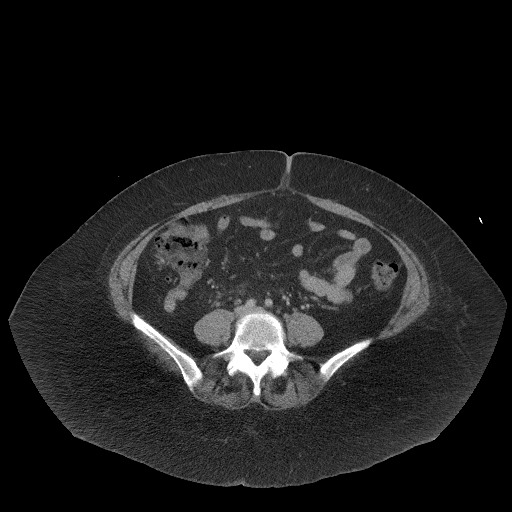
[im 48/96  soft-tissue]
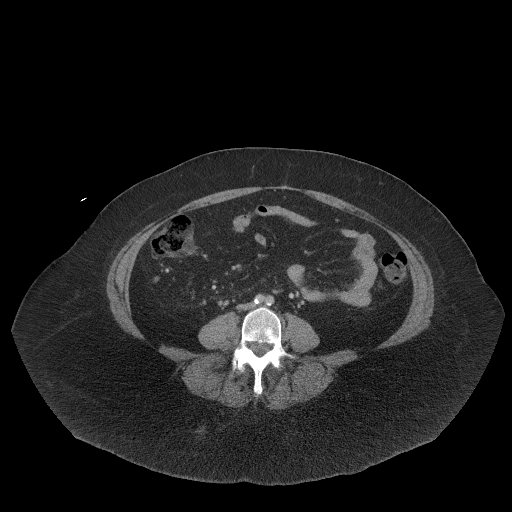
[im 53/96  soft-tissue]
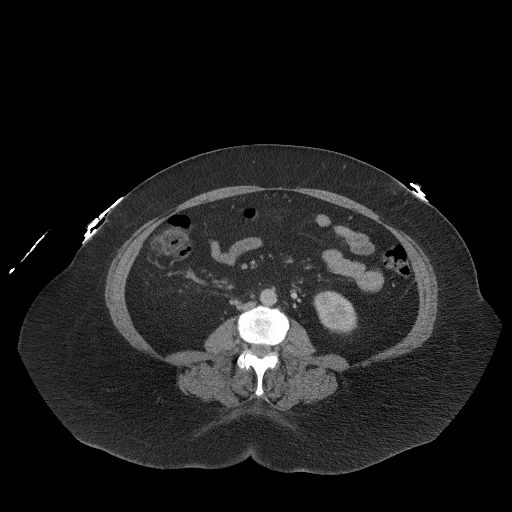
[im 62/96  soft-tissue]
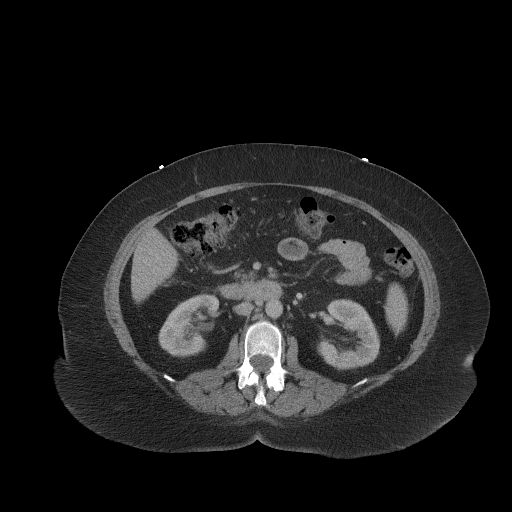
[im 62/96  bone]
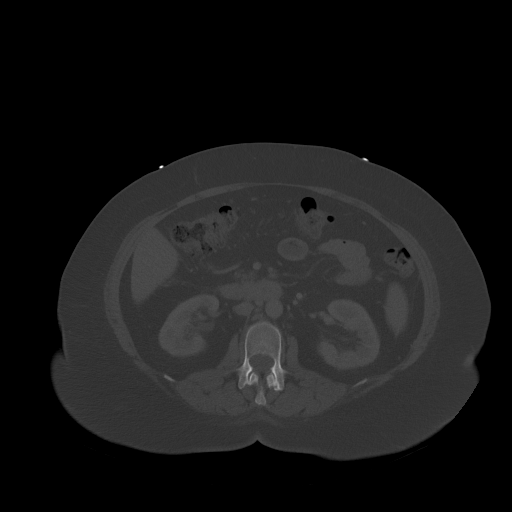
[im 67/96  soft-tissue]
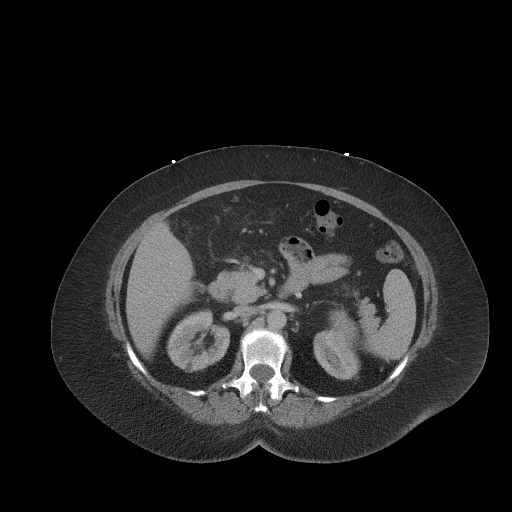
[im 77/96  soft-tissue]
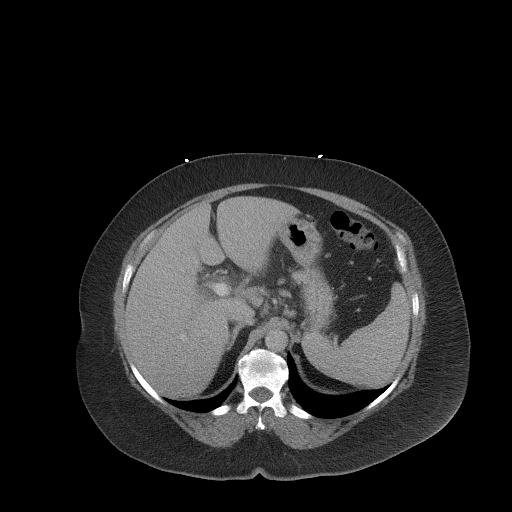
[im 81/96  soft-tissue]
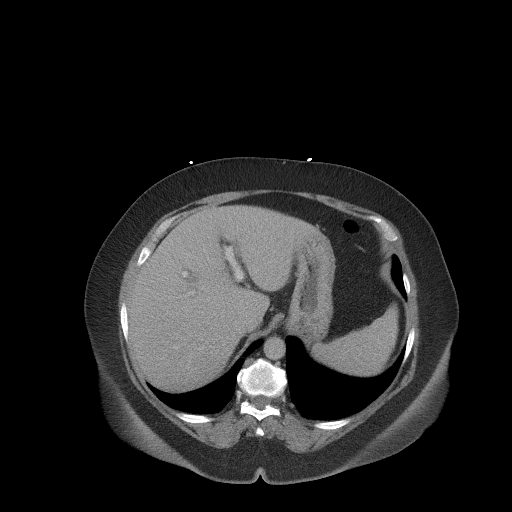
[im 91/96  soft-tissue]
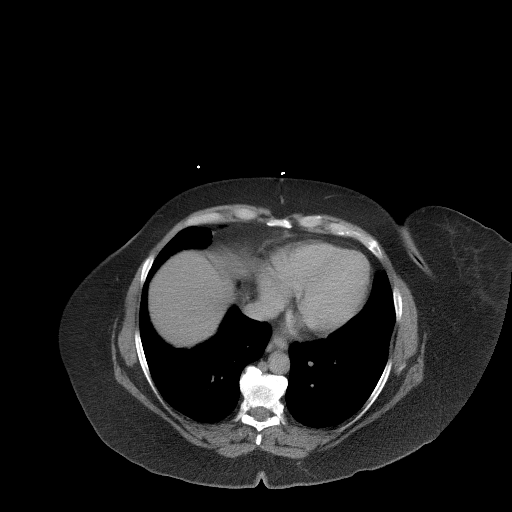

[Series 6: a/p w/ cor · coronal · 0.94mm/px · 3 of 176 slices shown]
[im 59/176  soft-tissue]
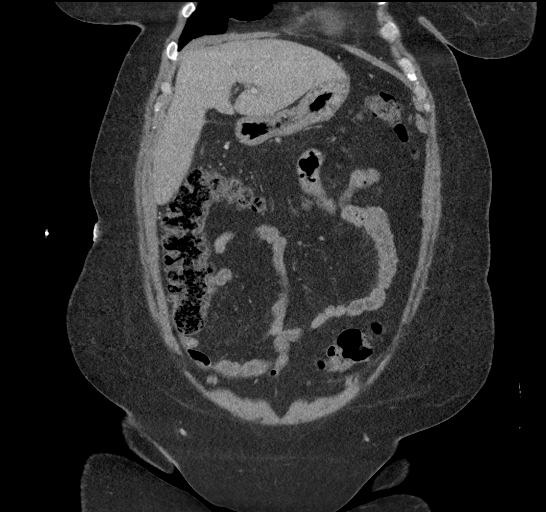
[im 78/176  soft-tissue]
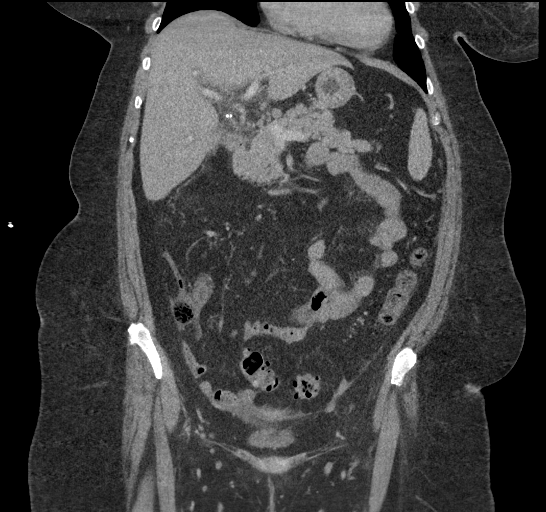
[im 98/176  soft-tissue]
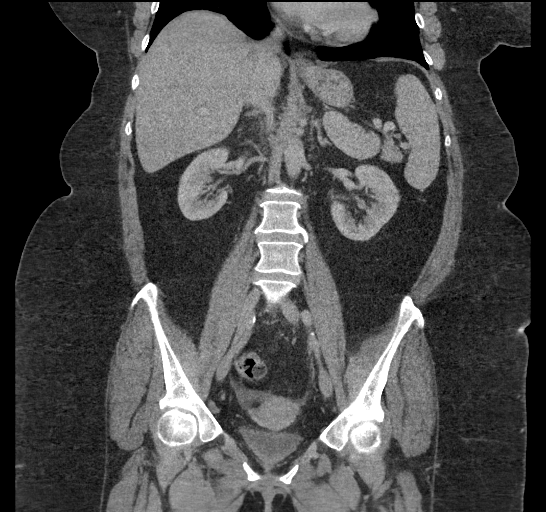

[16 of 46 positions shown; findings below may reference images not displayed]

FINDINGS: Lower chest: No acute abnormality.

Hepatobiliary: No focal hepatic abnormality. Status post
cholecystectomy. Mild periportal edema. Hazy edema at the porta
hepatis. No biliary dilatation. No definitive calcified stones in
the region of the common duct.

Pancreas: Unremarkable. No pancreatic ductal dilatation or
surrounding inflammatory changes.

Spleen: Normal in size without focal abnormality.

Adrenals/Urinary Tract: Adrenal glands are normal. No
hydronephrosis. Subcentimeter hypodensities within the kidneys too
small to further characterize. The bladder is nearly empty

Stomach/Bowel: Stomach is within normal limits. Appendix appears
normal. No evidence of bowel wall thickening, distention, or
inflammatory changes. Sigmoid colon diverticula without acute
inflammatory process

Vascular/Lymphatic: Mild aortic atherosclerosis. No aneurysm.
Peripancreatic and porta hepatis lymph nodes measuring up to 19 mm

Reproductive: Uterus and bilateral adnexa are unremarkable.

Other: Small free fluid in the pelvis.  No free air

Musculoskeletal: No acute or significant osseous findings.
IMPRESSION: 1. Status post cholecystectomy.  Negative for biliary dilatation.
2. There is mild periportal edema and generalized edema/inflammation
at the porta hepatis. Differential considerations include hepatitis
and biliary tract infection although there is no significant biliary
dilatation.
3. Sigmoid colon diverticular disease without acute inflammation

## 2021-09-09 ENCOUNTER — Other Ambulatory Visit: Payer: Self-pay | Admitting: Obstetrics and Gynecology

## 2021-09-09 DIAGNOSIS — Z1231 Encounter for screening mammogram for malignant neoplasm of breast: Secondary | ICD-10-CM

## 2021-09-27 ENCOUNTER — Ambulatory Visit
Admission: RE | Admit: 2021-09-27 | Discharge: 2021-09-27 | Disposition: A | Discharge status: Home / Self Care | Payer: BC Managed Care – PPO | Source: Ambulatory Visit | Attending: Obstetrics and Gynecology | Admitting: Obstetrics and Gynecology

## 2021-09-27 DIAGNOSIS — Z1231 Encounter for screening mammogram for malignant neoplasm of breast: Secondary | ICD-10-CM | POA: Diagnosis not present

## 2021-10-06 ENCOUNTER — Other Ambulatory Visit (HOSPITAL_COMMUNITY)
Admission: RE | Admit: 2021-10-06 | Discharge: 2021-10-06 | Disposition: A | Discharge status: Home / Self Care | Payer: BC Managed Care – PPO | Source: Ambulatory Visit | Attending: Obstetrics and Gynecology | Admitting: Obstetrics and Gynecology

## 2021-10-06 DIAGNOSIS — Z01419 Encounter for gynecological examination (general) (routine) without abnormal findings: Secondary | ICD-10-CM | POA: Diagnosis not present

## 2021-10-11 LAB — CYTOLOGY - PAP
Comment: NEGATIVE
Comment: NEGATIVE
HPV 16: POSITIVE — AB
HPV 18 / 45: NEGATIVE
High risk HPV: POSITIVE — AB

## 2021-11-14 DIAGNOSIS — N870 Mild cervical dysplasia: Secondary | ICD-10-CM | POA: Diagnosis not present

## 2021-12-16 DIAGNOSIS — Z0000 Encounter for general adult medical examination without abnormal findings: Secondary | ICD-10-CM | POA: Diagnosis not present

## 2021-12-16 DIAGNOSIS — Z13220 Encounter for screening for lipoid disorders: Secondary | ICD-10-CM | POA: Diagnosis not present

## 2022-06-20 DIAGNOSIS — K644 Residual hemorrhoidal skin tags: Secondary | ICD-10-CM | POA: Diagnosis not present

## 2022-06-20 DIAGNOSIS — K621 Rectal polyp: Secondary | ICD-10-CM | POA: Diagnosis not present

## 2022-06-20 DIAGNOSIS — D125 Benign neoplasm of sigmoid colon: Secondary | ICD-10-CM | POA: Diagnosis not present

## 2022-06-20 DIAGNOSIS — K5730 Diverticulosis of large intestine without perforation or abscess without bleeding: Secondary | ICD-10-CM | POA: Diagnosis not present

## 2022-06-20 DIAGNOSIS — D120 Benign neoplasm of cecum: Secondary | ICD-10-CM | POA: Diagnosis not present

## 2022-06-20 DIAGNOSIS — Z1211 Encounter for screening for malignant neoplasm of colon: Secondary | ICD-10-CM | POA: Diagnosis not present

## 2022-06-20 DIAGNOSIS — K648 Other hemorrhoids: Secondary | ICD-10-CM | POA: Diagnosis not present

## 2022-10-03 ENCOUNTER — Other Ambulatory Visit: Payer: Self-pay | Admitting: Internal Medicine

## 2022-10-03 DIAGNOSIS — Z1231 Encounter for screening mammogram for malignant neoplasm of breast: Secondary | ICD-10-CM

## 2022-10-12 ENCOUNTER — Other Ambulatory Visit (HOSPITAL_COMMUNITY)
Admission: RE | Admit: 2022-10-12 | Discharge: 2022-10-12 | Disposition: A | Discharge status: Home / Self Care | Payer: Managed Care, Other (non HMO) | Source: Ambulatory Visit | Attending: Obstetrics and Gynecology | Admitting: Obstetrics and Gynecology

## 2022-10-12 DIAGNOSIS — Z01419 Encounter for gynecological examination (general) (routine) without abnormal findings: Secondary | ICD-10-CM | POA: Insufficient documentation

## 2022-10-18 LAB — CYTOLOGY - PAP
Comment: NEGATIVE
Diagnosis: NEGATIVE
Diagnosis: REACTIVE
High risk HPV: NEGATIVE

## 2022-11-15 ENCOUNTER — Ambulatory Visit: Payer: BC Managed Care – PPO

## 2022-11-17 ENCOUNTER — Ambulatory Visit
Admission: RE | Admit: 2022-11-17 | Discharge: 2022-11-17 | Disposition: A | Discharge status: Home / Self Care | Payer: Managed Care, Other (non HMO) | Source: Ambulatory Visit | Attending: Internal Medicine | Admitting: Internal Medicine

## 2022-11-17 DIAGNOSIS — Z1231 Encounter for screening mammogram for malignant neoplasm of breast: Secondary | ICD-10-CM

## 2023-10-22 ENCOUNTER — Other Ambulatory Visit (HOSPITAL_COMMUNITY)
Admission: RE | Admit: 2023-10-22 | Discharge: 2023-10-22 | Disposition: A | Discharge status: Home / Self Care | Payer: Managed Care, Other (non HMO) | Source: Ambulatory Visit | Attending: Obstetrics and Gynecology | Admitting: Obstetrics and Gynecology

## 2023-10-22 DIAGNOSIS — Z01419 Encounter for gynecological examination (general) (routine) without abnormal findings: Secondary | ICD-10-CM | POA: Diagnosis present

## 2023-10-26 LAB — CYTOLOGY - PAP
Comment: NEGATIVE
Comment: NEGATIVE
Comment: NEGATIVE
Diagnosis: UNDETERMINED — AB
HPV 16: NEGATIVE
HPV 18 / 45: NEGATIVE
High risk HPV: POSITIVE — AB

## 2024-03-11 ENCOUNTER — Other Ambulatory Visit: Payer: Self-pay | Admitting: Internal Medicine

## 2024-03-11 DIAGNOSIS — Z1231 Encounter for screening mammogram for malignant neoplasm of breast: Secondary | ICD-10-CM

## 2024-04-01 ENCOUNTER — Ambulatory Visit
Admission: RE | Admit: 2024-04-01 | Discharge: 2024-04-01 | Disposition: A | Discharge status: Home / Self Care | Source: Ambulatory Visit | Attending: Internal Medicine | Admitting: Internal Medicine

## 2024-04-01 DIAGNOSIS — Z1231 Encounter for screening mammogram for malignant neoplasm of breast: Secondary | ICD-10-CM
